# Patient Record
Sex: Male | Born: 1970 | Race: Black or African American | Hispanic: No | Marital: Married | Smoking: Never smoker
Health system: Southern US, Community
[De-identification: ages and names within clinical notes are randomized; demographics above are authoritative.]

## PROBLEM LIST (undated history)

## (undated) DIAGNOSIS — I639 Cerebral infarction, unspecified: Secondary | ICD-10-CM

## (undated) DIAGNOSIS — E119 Type 2 diabetes mellitus without complications: Secondary | ICD-10-CM

## (undated) DIAGNOSIS — I10 Essential (primary) hypertension: Secondary | ICD-10-CM

## (undated) DIAGNOSIS — F419 Anxiety disorder, unspecified: Secondary | ICD-10-CM

## (undated) DIAGNOSIS — M109 Gout, unspecified: Secondary | ICD-10-CM

---

## 1992-09-20 HISTORY — PX: EXPLORATORY LAPAROTOMY: SUR591

## 1999-05-22 HISTORY — PX: ESOPHAGOGASTRODUODENOSCOPY ENDOSCOPY: SHX5814

## 2001-01-03 ENCOUNTER — Emergency Department (HOSPITAL_COMMUNITY): Admission: EM | Admit: 2001-01-03 | Discharge: 2001-01-03 | Payer: Self-pay | Admitting: Emergency Medicine

## 2002-12-18 ENCOUNTER — Encounter: Payer: Self-pay | Admitting: Emergency Medicine

## 2002-12-18 ENCOUNTER — Emergency Department (HOSPITAL_COMMUNITY): Admission: EM | Admit: 2002-12-18 | Discharge: 2002-12-18 | Payer: Self-pay | Admitting: Emergency Medicine

## 2002-12-19 ENCOUNTER — Emergency Department (HOSPITAL_COMMUNITY): Admission: EM | Admit: 2002-12-19 | Discharge: 2002-12-19 | Payer: Self-pay | Admitting: Emergency Medicine

## 2004-05-31 ENCOUNTER — Emergency Department (HOSPITAL_COMMUNITY): Admission: EM | Admit: 2004-05-31 | Discharge: 2004-05-31 | Payer: Self-pay | Admitting: Emergency Medicine

## 2004-07-31 ENCOUNTER — Inpatient Hospital Stay (HOSPITAL_COMMUNITY): Admission: EM | Admit: 2004-07-31 | Discharge: 2004-08-03 | Payer: Self-pay | Admitting: Emergency Medicine

## 2004-08-07 ENCOUNTER — Emergency Department (HOSPITAL_COMMUNITY): Admission: EM | Admit: 2004-08-07 | Discharge: 2004-08-08 | Payer: Self-pay | Admitting: Emergency Medicine

## 2005-08-12 ENCOUNTER — Inpatient Hospital Stay (HOSPITAL_COMMUNITY): Admission: AD | Admit: 2005-08-12 | Discharge: 2005-08-13 | Payer: Self-pay | Admitting: Internal Medicine

## 2006-08-06 ENCOUNTER — Inpatient Hospital Stay (HOSPITAL_COMMUNITY): Admission: EM | Admit: 2006-08-06 | Discharge: 2006-08-09 | Payer: Self-pay | Admitting: Emergency Medicine

## 2006-10-13 ENCOUNTER — Inpatient Hospital Stay (HOSPITAL_COMMUNITY): Admission: EM | Admit: 2006-10-13 | Discharge: 2006-10-17 | Payer: Self-pay | Admitting: Emergency Medicine

## 2009-02-16 ENCOUNTER — Emergency Department (HOSPITAL_COMMUNITY): Admission: EM | Admit: 2009-02-16 | Discharge: 2009-02-16 | Payer: Self-pay | Admitting: Emergency Medicine

## 2009-02-21 ENCOUNTER — Encounter (INDEPENDENT_AMBULATORY_CARE_PROVIDER_SITE_OTHER): Payer: Self-pay | Admitting: Otolaryngology

## 2009-02-21 ENCOUNTER — Ambulatory Visit (HOSPITAL_COMMUNITY): Admission: RE | Admit: 2009-02-21 | Discharge: 2009-02-22 | Payer: Self-pay | Admitting: Otolaryngology

## 2009-11-29 ENCOUNTER — Emergency Department (HOSPITAL_COMMUNITY): Admission: EM | Admit: 2009-11-29 | Discharge: 2009-11-29 | Payer: Self-pay | Admitting: Emergency Medicine

## 2010-12-13 LAB — POCT I-STAT, CHEM 8
BUN: 10 mg/dL (ref 6–23)
Calcium, Ion: 1.12 mmol/L (ref 1.12–1.32)
Chloride: 100 mEq/L (ref 96–112)
Creatinine, Ser: 1 mg/dL (ref 0.4–1.5)
Glucose, Bld: 126 mg/dL — ABNORMAL HIGH (ref 70–99)
HCT: 42 % (ref 39.0–52.0)
Hemoglobin: 14.3 g/dL (ref 13.0–17.0)
Potassium: 4.2 mEq/L (ref 3.5–5.1)
Sodium: 134 mEq/L — ABNORMAL LOW (ref 135–145)
TCO2: 33 mmol/L (ref 0–100)

## 2010-12-28 LAB — GLUCOSE, CAPILLARY
Glucose-Capillary: 109 mg/dL — ABNORMAL HIGH (ref 70–99)
Glucose-Capillary: 124 mg/dL — ABNORMAL HIGH (ref 70–99)
Glucose-Capillary: 136 mg/dL — ABNORMAL HIGH (ref 70–99)
Glucose-Capillary: 140 mg/dL — ABNORMAL HIGH (ref 70–99)
Glucose-Capillary: 187 mg/dL — ABNORMAL HIGH (ref 70–99)
Glucose-Capillary: 191 mg/dL — ABNORMAL HIGH (ref 70–99)
Glucose-Capillary: 213 mg/dL — ABNORMAL HIGH (ref 70–99)
Glucose-Capillary: 278 mg/dL — ABNORMAL HIGH (ref 70–99)

## 2010-12-28 LAB — CBC
HCT: 39.2 % (ref 39.0–52.0)
Hemoglobin: 13.2 g/dL (ref 13.0–17.0)
MCHC: 33.8 g/dL (ref 30.0–36.0)
MCV: 78.8 fL (ref 78.0–100.0)
Platelets: 406 10*3/uL — ABNORMAL HIGH (ref 150–400)
RBC: 4.97 MIL/uL (ref 4.22–5.81)
RDW: 14.6 % (ref 11.5–15.5)
WBC: 4.4 10*3/uL (ref 4.0–10.5)

## 2010-12-28 LAB — BASIC METABOLIC PANEL
BUN: 11 mg/dL (ref 6–23)
CO2: 28 mEq/L (ref 19–32)
Calcium: 9.3 mg/dL (ref 8.4–10.5)
Chloride: 102 mEq/L (ref 96–112)
Creatinine, Ser: 0.93 mg/dL (ref 0.4–1.5)
GFR calc Af Amer: >60 mL/min (ref >60)
GFR calc non Af Amer: >60 mL/min (ref >60)
Glucose, Bld: 152 mg/dL — ABNORMAL HIGH (ref 70–99)
Potassium: 4.4 mEq/L (ref 3.5–5.1)
Sodium: 138 mEq/L (ref 135–145)

## 2010-12-29 LAB — CBC
HCT: 42.6 % (ref 39.0–52.0)
Hemoglobin: 13.9 g/dL (ref 13.0–17.0)
MCHC: 32.6 g/dL (ref 30.0–36.0)
MCV: 79.4 fL (ref 78.0–100.0)
Platelets: 315 10*3/uL (ref 150–400)
RBC: 5.37 MIL/uL (ref 4.22–5.81)
RDW: 14.7 % (ref 11.5–15.5)
WBC: 6.7 10*3/uL (ref 4.0–10.5)

## 2010-12-29 LAB — POCT I-STAT, CHEM 8
BUN: 8 mg/dL (ref 6–23)
Calcium, Ion: 1.15 mmol/L (ref 1.12–1.32)
Chloride: 98 mEq/L (ref 96–112)
Creatinine, Ser: 1.3 mg/dL (ref 0.4–1.5)
Glucose, Bld: 262 mg/dL — ABNORMAL HIGH (ref 70–99)
HCT: 43 % (ref 39.0–52.0)
Hemoglobin: 14.6 g/dL (ref 13.0–17.0)
Potassium: 4.3 mEq/L (ref 3.5–5.1)
Sodium: 134 mEq/L — ABNORMAL LOW (ref 135–145)
TCO2: 30 mmol/L (ref 0–100)

## 2010-12-29 LAB — GLUCOSE, CAPILLARY: Glucose-Capillary: 245 mg/dL — ABNORMAL HIGH (ref 70–99)

## 2010-12-29 LAB — STREP A DNA PROBE: Group A Strep Probe: NEGATIVE

## 2010-12-29 LAB — RAPID STREP SCREEN (MED CTR MEBANE ONLY): Streptococcus, Group A Screen (Direct): NEGATIVE

## 2011-02-02 NOTE — Op Note (Signed)
[name], [name]             ACCOUNT NO.:  [id number]   MEDICAL RECORD NO.:  [id number]          PATIENT TYPE:  OIB   LOCATION:  5127                         FACILITY:  [facility]   PHYSICIAN:  Jefry H. Pollyann Kennedy, MD     DATE OF BIRTH:  05/25/1971   DATE OF PROCEDURE:  02/21/2009  DATE OF DISCHARGE:                               OPERATIVE REPORT   PREOPERATIVE DIAGNOSIS:  Peritonsillar abscess.   POSTOPERATIVE DIAGNOSIS:  Peritonsillar abscess.   PROCEDURE:  Tonsillectomy.   SURGEON:  Jefry H. Pollyann Kennedy, MD   ANESTHESIA:  General endotracheal anesthesia was used.   COMPLICATIONS:  No complications.   BLOOD LOSS:  About 80 mL.   FINDINGS:  Very large and diffusely inflamed tonsils bilaterally, much  worse on the left side with peritonsillar space fibrosis without any  abscess identified today.   HISTORY:  A 40 year old was seen last Sunday with findings consistent  with peritonsillar abscess on the left.  Attempted aspiration revealed  scant amount of pus.  He was treated with antibiotics and over about 3-  day period was found not to be improving.  Decision was then made to  perform tonsillectomy at the next available time.  Risks, benefits,  alternatives, and complications of the procedure were explained to the  patient, seemed to understand, and agreed to surgery.   PROCEDURE:  The patient was taken to the operating room, placed on the  operating table in supine position.  Following induction of general  endotracheal anesthesia, the table was turned, and the patient was  draped in the standard fashion.  A Crowe-Davis mouth gag was inserted  into the oral cavity, used to retract the tongue, and mandible attached  to Mayo stand.  Red rubber catheter was inserted into the right side of  the nose, withdrawn through the mouth and used to retract the soft  palate and uvula.  Combination of electrocautery, blunt dissection and  suction cautery was used to dissect the tonsils.  The  right side was  performed first.  The right tonsil was removed in its entirety and sent  for pathologic evaluation.  This was labeled right tonsil.  Suction  cautery was used to provide hemostasis.  On the left side, the tonsil  was severely inflamed and with fibrotic surrounding, I was unable to  truly identify the peritonsillar plane.  I was unable to identify any  abscess cavity either.  I attempted multiple aspirations with an 18-  guage needle and did not reveal any pus.  This tonsil was dissected out  using combination of cautery and  blunt dissection with a Hurd dissector and was removed in a piecemeal  fashion.  Adequate level of hemostasis was achieved.  The pharynx was  then irrigated with saline and suctioned.  An orogastric tube was used  to aspirate the contents of the stomach.  The patient was awakened,  extubated, and transferred to recovery in stable condition.      Jefry H. Pollyann Kennedy, MD  Electronically Signed     JHR/MEDQ  D:  02/21/2009  T:  02/22/2009  Job:  [id number]

## 2011-02-02 NOTE — Consult Note (Signed)
[name], [name]             ACCOUNT NO.:  [id number]   MEDICAL RECORD NO.:  [id number]          PATIENT TYPE:  EMS   LOCATION:  MAJO                         FACILITY:  [facility]   PHYSICIAN:  Jefry H. Pollyann Kennedy, MD     DATE OF BIRTH:  1970-10-09   DATE OF CONSULTATION:  DATE OF DISCHARGE:                                 CONSULTATION   TIME:  7:30 p.m.   REASON FOR CONSULTATION:  Severe sore throat and peritonsillar abscess.   HISTORY:  This is a 40 year old with a 1-week history of progressively  worsening sore throat, mainly on the left side.  He has no prior history  of tonsil or throat problems.  He has been able to eat and drink  reasonably well.   PAST MEDICAL HISTORY:  Significant for diabetes.   PHYSICAL EXAMINATION:  Healthy-appearing gentleman, in no distress.  He  is moving air well without having trouble handling secretions.  He does  not have a muffled voice.  He is slightly tender adenopathy in the left  tonsil region.  There is no significant trismus.  Nasal exam  unremarkable.  Oral cavity and pharynx reveal fullness of the left  tonsil and left soft palate.   IMPRESSION:  Possible peritonsillar abscess.   PLAN:  Perform incision, drainage, and aspiration.   PROCEDURE IN DETAIL:  Xylocaine with epinephrine was infiltrated into  the left side of soft palate and anterior faucil arch. A 18-gauge needle  was used to attempt aspiration in multiple locations.  The very small  amount of pus was obtained.  It was left than 0.5 mL.  A 11 scalpel was  used to incise the mucosa overlying the aspirated site and the tonsil  hemostat was used to spread apart the tissues within the peritonsillar  plane.  There was no further pus obtained.  He tolerated the procedure  well.   IMPRESSION:  Status post aspiration of very early peritonsillar abscess.   PLAN:  Start him on antibiotics with Augmentin and treat him with  analgesics.  He will follow up with me in the office in 2  days to  monitor his progress or he will contact me tomorrow if he gets any  worse.      Jefry H. Pollyann Kennedy, MD  Electronically Signed     JHR/MEDQ  D:  02/17/2009  T:  02/17/2009  Job:  [id number]

## 2011-02-05 NOTE — Discharge Summary (Signed)
NAMEHARROLD, [name]             ACCOUNT NO.:  [id number]   MEDICAL RECORD NO.:  [id number]          PATIENT TYPE:  INP   LOCATION:  0364                         FACILITY:  [facility]   PHYSICIAN:  Jackie Plum, M.D.DATE OF BIRTH:  [birth year]   DATE OF ADMISSION:  07/31/2004  DATE OF DISCHARGE:  08/03/2004                                 DISCHARGE SUMMARY   DISCHARGE DIAGNOSES:  1.  Hyperosmolar hyperglycemic state, nonketotic, resolved.  2.  Newly diagnosed diabetes mellitus, uncontrolled.  3.  Acute renal failure, resolved.  4.  Electrolyte imbalance, resolved.  5.  Hypertension.  6.  Obesity.  7.  Dyslipidemia.   DISCHARGE MEDICATIONS:  1.  Zocor 20 mg daily.  2.  Lovenox.  3.  Glucotrol 7.5mg  daily  4.  Lantus 12 units subcutaneous injections daily.  5.  Altace 5 mg daily.  6.  Labetalol 400 mg daily.  7.  Glucophage 80 mg daily.   ACTIVITY:  As tolerated.   DIET:  2000 calorie ADA diet.   FOLLOWUP:  The patient to follow up with Dr. Concepcion Elk on August 10, 2004 at  10:45 a.m. for follow up.   DISCHARGE INSTRUCTIONS:  Instructed to take his medications and diet as  instructed.  He is to report to his M.D. or the emergency room if he should  have any problems.   REASON FOR HOSPITALIZATION:  Diabetic ketoacidosis.  Please see H&P dictated  by Dr. Jasmine Pang.  The patient presented with polyuria, polydipsia, with  increasing disorientation.  He did not have any prior history of diabetes  mellitus.  He had been feeling very fatigued with mild visual blurring.  He  did not have any dysuria, fever, or chills.  He has a history of gout, and  has been taking indomethacin for this.  In the emergency room, the patient's  blood pressure was elevated at 171/105, pulse of 140, respirations 20,  saturations of 93% on room air.  According to the H&P by Dr. Jasmine Pang,  the patient with cardiac auscultation without any gallops or murmurs.  Her  pulmonary auscultation did  not reveal an adventitious sounds.  He was not  cyanotic, and dorsalis pedis pulses were 1 to 2+.  Cranial nerves II-XII  were grossly intact.  Laboratory work indicated a pH of 7.29; however, his  bicarbonate was 22, and his sodium was 124, with a BUN of 39, creatinine of  4.1.  His phosphorus was only 1.  ECG shows sinus tachycardia.  He was  initially admitted for suspected DKA; however, on further evaluation, it was  realized that the patient did not have a consistent anion gap.  His  bicarbonate remained within normal limits, and the isolated pH of 7.9, which  was in acidemic status, and was thought not to be related to DKA, but rather  possibly to ketoacidosis.  The patient was, therefore, thought to have  rather hyperosmolar hyperglycemic nonketotic state.  He was managed with IV  insulin for this with IV fluids.  His fluid and electrolyte status was  carefully monitored and corrected appropriately, and with these measures,  the patient's hyperglycemia improved, with resolution of his acute renal  failure, which was due to volume depletion with possible ATN.  The patient  was subsequently transferred to a telemetry bed.  He received diabetes  education by the diabetes coordinator, and also has been taught how to  inject himself with Lantus.  The patient is a truck driver, and he is going  to check with his employer as to policies regarding diabetes, especially for  patients on Lantus.  He has been asked not to drive until this is done.  On  rounds today, the patient feels fine, does not have any weakness, no  polyuria, polydipsia, fever, or chills.  He is back to his baseline status,  and is ready for discharge.  His vital signs were stable.  Repeat radial  pulse by me was 84 beats per minute.  He is not clinically dehydrated.  His  mucous membranes are moist without any exudate or erythema on oropharynx  exam.  Lungs are clear to auscultation.  Cardiac exam was unremarkable.   Abdomen was soft, nontender.  No edema of extremities.  He is appropriate  for discharge today with follow ups as planned.  He will also be following  with the outpatient diabetes center.  We will assist him to get himself a  Glucometer and lancets and other required supplies for him to be able to  monitor his glucose carefully at home.  He has received adequate education  with regard to diabetes.  He has been shown a video tape on diabetes, and he  has expressed a clear understanding.   I spent more than preparing patient for discharge today.     [name]   GO/MEDQ  D:  08/03/2004  T:  08/03/2004  Job:  [id number]   cc:   Fleet Contras, M.D.  [address]  [place]  [place] [address]  Fax: [phone]

## 2011-02-05 NOTE — Discharge Summary (Signed)
[name], [name]             ACCOUNT NO.:  [id number]   MEDICAL RECORD NO.:  [id number]          PATIENT TYPE:  INP   LOCATION:  2004                         FACILITY:  [facility]   PHYSICIAN:  Fleet Contras, M.D.    DATE OF BIRTH:  [birth year]   DATE OF ADMISSION:  08/06/2006  DATE OF DISCHARGE:  08/09/2006                               DISCHARGE SUMMARY   ADMISSION DIAGNOSES:  1. Diabetic ketoacidosis.  2. Hyperkalemia.  3. Renal insufficiency.   DISCHARGE DIAGNOSES:  1. Diabetic ketoacidosis, resolved.  2. Uncontrolled type 2 diabetes mellitus.  3. Hyperkalemia, resolved.  4. Renal insufficiency, resolved.  5. Hypertension.  6. Dyslipidemia.   HISTORY OF PRESENTING ILLNESS:  Mr. [name] is a 40 year old African-  American gentleman with past medical history significant for type 2  diabetes mellitus, hypertension, and dyslipidemia.  He was last seen in  my office about a year ago and has not been compliant with his office  visits or his mediations.  He apparently went on trip to the [place]  about 2 weeks ago and forgot to take his medications with him, and he  came back with complaints of frequent urination, generally feeling  unwell, weakness, and crampy abdominal pain.  On evaluation at the  emergency room, he was noted to be not in acute respiratory or painful  distress.  His initial serum glucose was 752 with a pH of 7.28, a serum  potassium of 7.4.  His BUN and creatinine were also elevated at 46/2.4.  His sodium was 120 and chloride was 81, bicarbonate of 13.  His  hemoglobin A1c was 16.  He was initially admitted to a stepdown bed with  a cardiac monitor and started on a Glucommander protocol.   HOSPITAL COURSE:  On admission, patient was started on a Glucommander  protocol with intravenous insulin infusion.  His ABGs were monitored q.  hourly as well as monitoring of his potassium and renal function.  His  urine intake and output were also monitored closely, and  he made  significant improvement within 24 hours and was feeling much better, and  his sugars were down to the 200s, and his potassium was down to 3.8, and  he had to require potassium replacements orally.  His condition  continued to improve during the course of hospitalization.  Was started  on subcutaneous Lantus insulin once his sugars were down and tapered off  the intravenous insulin and Glucommander.  He tolerated this well.  He  started to eat normally without any nausea, abdominal pain, or nausea.   Today, he is feeling much better.  He is not in any acute pain or  respiratory distress.  His glucose this morning is in the 160s.  His  blood pressure is 133/77, heart rate of 85, respiratory rate of 20.  O2  sats on room air are 99%, and temperature is 97 degrees' Fahrenheit.  His chest is clear to auscultation.  Heart sounds 1 and 2 are heard with  no murmurs.  Abdomen is soft, nontender, no masses.  Bowel sounds are  present.  Extremities show no edema,  no calf tenderness, or swelling.   LABORATORY DATA:  Today shows a sodium of 134, potassium 3.9, chloride  105, bicarbonate of 24, BUN of 10, creatinine 1.0, glucose of 204.  His  lipid profile shows a total cholesterol of 195, triglycerides 427, HDL  of 35, and LDL could not be calculated.  TSH is 2.99.  He is therefore  considered stable for discharge home today.   DISCHARGE MEDICATIONS:  1. Lantus insulin 40 units subcu q. h.s.  2. Lipitor 10 mg daily.  3. Metformin 500 mg b.i.d.  4. Altace 10 mg daily.  5. Aspirin 81 mg daily.   He is to follow up with me in 1 week, and he has been advised to take  his medications as prescribed and make sure he makes his followup  appointments.  He is to be on a 2-g sodium, carbohydrate-modified diet,  and he is not to return to work for 2 weeks.  This plan of care was  discussed with him and his questions answered.      Fleet Contras, M.D.  Electronically Signed     EA/MEDQ  D:   08/09/2006  T:  08/10/2006  Job:  [id number]
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
## 2011-02-05 NOTE — Discharge Summary (Signed)
[name], [name]             ACCOUNT NO.:  [id number]   MEDICAL RECORD NO.:  [id number]          PATIENT TYPE:  INP   LOCATION:  0364                         [facility]ACILITY:  [facility]   PHYSICIAN:  Jackie Plum, M.D.DATE OF BIRTH:  [birth year]   DATE OF ADMISSION:  07/31/2004  DATE OF DISCHARGE:  08/03/2004                                 DISCHARGE SUMMARY   ADDENDUM:  This is an addendum to previous discharge summary #[id number].   DISCHARGE LABORATORY DATA:  WBC count 6.2, hemoglobin 12.9, hematocrit 38.1,  MCV 76.8, platelet count 239.  Sodium 140, potassium 4.3, chloride 112, CO2  24, glucose 237, BUN 15, creatinine 1.6, calcium 8.4, phosphorus 2.7, total  cholesterol 133, triglycerides 624 (patient apparently on had eaten when  sample was taken - it will need to be repeated), HDL 18.  Total iron 117,  total iron binding capacity 201, % saturation 58, B12 327, folate 4.7,  ferritin 444.   CONSULTANTS:  Not applicable.   PROCEDURES:  Not applicable.   CONDITION ON DISCHARGE:  Improved and satisfactory.   DISPOSITION:  The patient is going home.     [name]   GO/MEDQ  D:  08/03/2004  T:  08/03/2004  Job:  [id number]

## 2011-02-05 NOTE — Discharge Summary (Signed)
NAMEJACKEY, HOUSEY NO.:  [id number]   MEDICAL RECORD NO.:  [id number]          PATIENT TYPE:  INP   LOCATION:  5714                         FACILITY:  [facility]   PHYSICIAN:  Fleet Contras, M.D.    DATE OF BIRTH:  26-Sep-1970   DATE OF ADMISSION:  10/13/2006  DATE OF DISCHARGE:  10/17/2006                               DISCHARGE SUMMARY   HISTORY OF PRESENTING ILLNESS:  Mr. [name] is a 40 year old  African American gentleman with past medical history significant for  type 2 diabetes mellitus, hypertension, dyslipidemia, gout, and medical  noncompliance.  This is another required admission for this gentleman  who has been noncompliant with his medications and office appointments.  He presented to the emergency room at [facility] brought in by  the EMS after his wife found him unresponsive at home.  He had  apparently been out of his medication therapy for a few days.  Unable to  keep his food, drink or medications down.  His wife found him at home on  the floor.  Was unable to state how long he had been there.  She  therefore called the EMS who brought him to the emergency room.  He had  not been checking his sugars for a few days, and he had no history of  any recent cold, cough, fevers, chills, or flu-like symptoms.  He had no  chest pain, shortness of breath, headaches, dizziness, or seizures.   In the emergency room, his mental status was altered.  Blood pressure  was 100/59, heart rate of 140.  Initial laboratory data showed glucose  of 1033.  His pH was 7.1.  Potassium was 7.9, BUN 62, creatinine 3.2,  and a bicarbonate of 4.  He was in diabetic ketoacidosis with severe  hyperkalemia, hyponatremia, renal insufficiency, dehydration, and severe  hyperglycemia requiring intensive  management.   HOSPITAL COURSE:  On admission, the patient was placed on a Glucommander  protocol, intravenous fluids, intravenous saline.  His input and output  were  monitored closely as well as his electrolytes and pH.  His  condition began to improve.  Within 24 hours, his potassium was down to  4.9.  Glucose was down to 500, BUN down to 58, creatinine 3.19, and  sodium 137.  His magnesium was 3.3, phosphorus was 3.5.  An EKG showed  sinus tachycardia.  The assistance of elink ICU physician was utilized  during the course of his hospitalization.  The patient became more  alert, oriented and conversational within 24 hours of admission.  His  condition continued to improve.  On October 15, 2006, his renal  insufficiency, electrolyte imbalance as well as dehydration have  resolved.  His glucose was down to 183.  It was started on Lantus  insulin as well as sliding scale NovoLog insulin.  Diet was resumed.  On  October 17, 2006, he was feeling much better, tolerating a full diet,  and ambulating.   PHYSICAL EXAMINATION:  VITAL SIGNS:  His vital signs were stable, and he  was well hydrated.  CHEST:  His chest was clear  to auscultation.  HEART:  Heart sounds one and two were heard, with no murmurs.  ABDOMEN:  Soft and nontender.  Bowel sounds were present.  EXTREMITIES:  Showed no edema.  No calf tenderness or swelling.  PSYCHIATRIC:  He was alert and oriented x3, with no focal neurological  deficits.   LABORATORY DATA:  Laboratory data then showed sodium of 138, potassium  3.3, chloride 107, bicarbonate of 26, BUN of 9, creatinine 0.95, and  glucose of 152.  He was therefore considered stable for discharge.   DISCHARGE DIAGNOSES:  1. Diabetic ketoacidosis.  2. Severe hyperglycemia, resolved.  3. Severe hyperkalemia, resolved.  4. Severe hyponatremia, resolved.  5. Dehydration, resolved.  6. Renal insufficiency acute, resolved.  7. Medical noncompliance.  The patient was again advised and      encouraged to show adequate compliance with medications and follow-      up office visits.   DISCHARGE MEDICATIONS:  1. Metformin 850 mg b.i.d.  2.  Zocor 20 mg daily.  3. Altace 10 mg daily.  4. Januvia 100 mg daily.  5. Lantus insulin 40 units at bedtime.   He is to follow up with me on October 19, 2006, at 9:00 a.m.  This plan  of care was discussed with him, and his questions were answered.      Fleet Contras, M.D.  Electronically Signed     EA/MEDQ  D:  11/30/2006  T:  12/01/2006  Job:  [id number]
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
## 2011-04-10 ENCOUNTER — Emergency Department (HOSPITAL_COMMUNITY): Payer: Self-pay

## 2011-04-10 ENCOUNTER — Emergency Department (HOSPITAL_COMMUNITY)
Admission: EM | Admit: 2011-04-10 | Discharge: 2011-04-10 | Disposition: A | Discharge status: Home / Self Care | Payer: Self-pay | Attending: Emergency Medicine | Admitting: Emergency Medicine

## 2011-04-10 DIAGNOSIS — R12 Heartburn: Secondary | ICD-10-CM | POA: Insufficient documentation

## 2011-04-10 DIAGNOSIS — E119 Type 2 diabetes mellitus without complications: Secondary | ICD-10-CM | POA: Insufficient documentation

## 2011-04-10 DIAGNOSIS — R112 Nausea with vomiting, unspecified: Secondary | ICD-10-CM | POA: Insufficient documentation

## 2011-04-10 DIAGNOSIS — E785 Hyperlipidemia, unspecified: Secondary | ICD-10-CM | POA: Insufficient documentation

## 2011-04-10 DIAGNOSIS — R197 Diarrhea, unspecified: Secondary | ICD-10-CM | POA: Insufficient documentation

## 2011-04-10 DIAGNOSIS — I10 Essential (primary) hypertension: Secondary | ICD-10-CM | POA: Insufficient documentation

## 2011-04-10 DIAGNOSIS — R141 Gas pain: Secondary | ICD-10-CM | POA: Insufficient documentation

## 2011-04-10 DIAGNOSIS — R109 Unspecified abdominal pain: Secondary | ICD-10-CM | POA: Insufficient documentation

## 2011-04-10 DIAGNOSIS — R142 Eructation: Secondary | ICD-10-CM | POA: Insufficient documentation

## 2011-04-10 LAB — URINALYSIS, ROUTINE W REFLEX MICROSCOPIC
Bilirubin Urine: NEGATIVE
Glucose, UA: NEGATIVE mg/dL
Hgb urine dipstick: NEGATIVE
Ketones, ur: NEGATIVE mg/dL
Leukocytes, UA: NEGATIVE
Nitrite: NEGATIVE
Protein, ur: 30 mg/dL — AB
Specific Gravity, Urine: 1.023 (ref 1.005–1.030)
Urobilinogen, UA: 1 mg/dL (ref 0.0–1.0)
pH: 6 (ref 5.0–8.0)

## 2011-04-10 LAB — CBC
MCV: 74.9 fL — ABNORMAL LOW (ref 78.0–100.0)
Platelets: 251 10*3/uL (ref 150–400)
RBC: 5.61 MIL/uL (ref 4.22–5.81)
RDW: 13.4 % (ref 11.5–15.5)
WBC: 5 10*3/uL (ref 4.0–10.5)

## 2011-04-10 LAB — BASIC METABOLIC PANEL
CO2: 33 mEq/L — ABNORMAL HIGH (ref 19–32)
Chloride: 91 mEq/L — ABNORMAL LOW (ref 96–112)
Creatinine, Ser: 1.17 mg/dL (ref 0.50–1.35)
GFR calc Af Amer: >60 mL/min (ref >60)
Potassium: 3.2 mEq/L — ABNORMAL LOW (ref 3.5–5.1)

## 2011-04-10 LAB — URINE MICROSCOPIC-ADD ON

## 2011-04-12 LAB — HEPATIC FUNCTION PANEL
ALT: 14 U/L (ref 0–53)
AST: 14 U/L (ref 0–37)
Albumin: 3.9 g/dL (ref 3.5–5.2)
Bilirubin, Direct: 0.2 mg/dL (ref 0.0–0.3)
Indirect Bilirubin: 0.6 mg/dL (ref 0.3–0.9)
Total Protein: 7.9 g/dL (ref 6.0–8.3)

## 2011-04-12 LAB — GLUCOSE, CAPILLARY: Glucose-Capillary: 176 mg/dL — ABNORMAL HIGH (ref 70–99)

## 2011-04-12 LAB — LIPASE, BLOOD: Lipase: 50 U/L (ref 11–59)

## 2011-04-12 LAB — LACTIC ACID, PLASMA: Lactic Acid, Venous: 0.9 mmol/L (ref 0.5–2.2)

## 2011-05-02 ENCOUNTER — Emergency Department (HOSPITAL_COMMUNITY): Payer: Self-pay

## 2011-05-02 ENCOUNTER — Emergency Department (HOSPITAL_COMMUNITY)
Admission: EM | Admit: 2011-05-02 | Discharge: 2011-05-03 | Disposition: A | Discharge status: Home / Self Care | Payer: Self-pay | Attending: Emergency Medicine | Admitting: Emergency Medicine

## 2011-05-02 DIAGNOSIS — I10 Essential (primary) hypertension: Secondary | ICD-10-CM | POA: Insufficient documentation

## 2011-05-02 DIAGNOSIS — R112 Nausea with vomiting, unspecified: Secondary | ICD-10-CM | POA: Insufficient documentation

## 2011-05-02 DIAGNOSIS — R197 Diarrhea, unspecified: Secondary | ICD-10-CM | POA: Insufficient documentation

## 2011-05-02 DIAGNOSIS — E785 Hyperlipidemia, unspecified: Secondary | ICD-10-CM | POA: Insufficient documentation

## 2011-05-02 DIAGNOSIS — R1013 Epigastric pain: Secondary | ICD-10-CM | POA: Insufficient documentation

## 2011-05-02 DIAGNOSIS — R1032 Left lower quadrant pain: Secondary | ICD-10-CM | POA: Insufficient documentation

## 2011-05-02 DIAGNOSIS — E119 Type 2 diabetes mellitus without complications: Secondary | ICD-10-CM | POA: Insufficient documentation

## 2011-05-02 LAB — URINALYSIS, ROUTINE W REFLEX MICROSCOPIC
Bilirubin Urine: NEGATIVE
Ketones, ur: NEGATIVE mg/dL
Nitrite: NEGATIVE
Urobilinogen, UA: 0.2 mg/dL (ref 0.0–1.0)

## 2011-05-02 LAB — COMPREHENSIVE METABOLIC PANEL WITH GFR
AST: 18 U/L (ref 0–37)
Albumin: 4 g/dL (ref 3.5–5.2)
Alkaline Phosphatase: 56 U/L (ref 39–117)
BUN: 16 mg/dL (ref 6–23)
CO2: 28 meq/L (ref 19–32)
Chloride: 96 meq/L (ref 96–112)
GFR calc non Af Amer: >60 mL/min (ref >60)
Potassium: 3.8 meq/L (ref 3.5–5.1)
Total Bilirubin: 0.3 mg/dL (ref 0.3–1.2)

## 2011-05-02 LAB — COMPREHENSIVE METABOLIC PANEL
ALT: 28 U/L (ref 0–53)
Calcium: 9.6 mg/dL (ref 8.4–10.5)
Creatinine, Ser: 1.21 mg/dL (ref 0.50–1.35)
GFR calc Af Amer: >60 mL/min (ref >60)
Glucose, Bld: 138 mg/dL — ABNORMAL HIGH (ref 70–99)
Sodium: 135 mEq/L (ref 135–145)
Total Protein: 8.3 g/dL (ref 6.0–8.3)

## 2011-05-02 LAB — DIFFERENTIAL
Basophils Absolute: 0 10*3/uL (ref 0.0–0.1)
Basophils Relative: 0 % (ref 0–1)
Eosinophils Absolute: 0 K/uL (ref 0.0–0.7)
Eosinophils Relative: 0 % (ref 0–5)
Lymphocytes Relative: 25 % (ref 12–46)
Lymphs Abs: 1.4 K/uL (ref 0.7–4.0)
Monocytes Absolute: 0.4 10*3/uL (ref 0.1–1.0)
Monocytes Relative: 8 % (ref 3–12)
Neutro Abs: 3.8 K/uL (ref 1.7–7.7)
Neutrophils Relative %: 67 % (ref 43–77)

## 2011-05-02 LAB — CBC
HCT: 41.1 % (ref 39.0–52.0)
Hemoglobin: 14.5 g/dL (ref 13.0–17.0)
MCH: 26.8 pg (ref 26.0–34.0)
MCHC: 35.3 g/dL (ref 30.0–36.0)
MCV: 76 fL — ABNORMAL LOW (ref 78.0–100.0)
Platelets: 308 K/uL (ref 150–400)
RBC: 5.41 MIL/uL (ref 4.22–5.81)
RDW: 14 % (ref 11.5–15.5)
WBC: 5.6 K/uL (ref 4.0–10.5)

## 2011-05-02 LAB — LACTIC ACID, PLASMA: Lactic Acid, Venous: 1.1 mmol/L (ref 0.5–2.2)

## 2011-05-02 LAB — LIPASE, BLOOD: Lipase: 29 U/L (ref 11–59)

## 2011-05-04 ENCOUNTER — Emergency Department (HOSPITAL_COMMUNITY): Payer: Self-pay

## 2011-05-04 ENCOUNTER — Emergency Department (HOSPITAL_COMMUNITY)
Admission: EM | Admit: 2011-05-04 | Discharge: 2011-05-04 | Disposition: A | Discharge status: Home / Self Care | Payer: Self-pay | Attending: Emergency Medicine | Admitting: Emergency Medicine

## 2011-05-04 ENCOUNTER — Encounter (HOSPITAL_COMMUNITY): Payer: Self-pay

## 2011-05-04 DIAGNOSIS — I10 Essential (primary) hypertension: Secondary | ICD-10-CM | POA: Insufficient documentation

## 2011-05-04 DIAGNOSIS — E785 Hyperlipidemia, unspecified: Secondary | ICD-10-CM | POA: Insufficient documentation

## 2011-05-04 DIAGNOSIS — Z794 Long term (current) use of insulin: Secondary | ICD-10-CM | POA: Insufficient documentation

## 2011-05-04 DIAGNOSIS — E119 Type 2 diabetes mellitus without complications: Secondary | ICD-10-CM | POA: Insufficient documentation

## 2011-05-04 DIAGNOSIS — R109 Unspecified abdominal pain: Secondary | ICD-10-CM | POA: Insufficient documentation

## 2011-05-04 DIAGNOSIS — R197 Diarrhea, unspecified: Secondary | ICD-10-CM | POA: Insufficient documentation

## 2011-05-04 DIAGNOSIS — R112 Nausea with vomiting, unspecified: Secondary | ICD-10-CM | POA: Insufficient documentation

## 2011-05-04 HISTORY — DX: Essential (primary) hypertension: I10

## 2011-05-04 LAB — DIFFERENTIAL
Basophils Absolute: 0 10*3/uL (ref 0.0–0.1)
Basophils Relative: 1 % (ref 0–1)
Eosinophils Absolute: 0 10*3/uL (ref 0.0–0.7)
Eosinophils Relative: 1 % (ref 0–5)
Lymphs Abs: 1.4 10*3/uL (ref 0.7–4.0)
Neutrophils Relative %: 56 % (ref 43–77)

## 2011-05-04 LAB — GLUCOSE, CAPILLARY: Glucose-Capillary: 129 mg/dL — ABNORMAL HIGH (ref 70–99)

## 2011-05-04 LAB — URINALYSIS, ROUTINE W REFLEX MICROSCOPIC
Glucose, UA: NEGATIVE mg/dL
Hgb urine dipstick: NEGATIVE
Ketones, ur: NEGATIVE mg/dL
Leukocytes, UA: NEGATIVE
Protein, ur: NEGATIVE mg/dL
pH: 5.5 (ref 5.0–8.0)

## 2011-05-04 LAB — COMPREHENSIVE METABOLIC PANEL
ALT: 27 U/L (ref 0–53)
AST: 19 U/L (ref 0–37)
Albumin: 4 g/dL (ref 3.5–5.2)
Alkaline Phosphatase: 57 U/L (ref 39–117)
BUN: 15 mg/dL (ref 6–23)
CO2: 30 mEq/L (ref 19–32)
Calcium: 9.8 mg/dL (ref 8.4–10.5)
Chloride: 94 mEq/L — ABNORMAL LOW (ref 96–112)
Creatinine, Ser: 1.35 mg/dL (ref 0.50–1.35)
GFR calc Af Amer: >60 mL/min (ref >60)
GFR calc non Af Amer: 59 mL/min — ABNORMAL LOW (ref >60)
Glucose, Bld: 123 mg/dL — ABNORMAL HIGH (ref 70–99)
Potassium: 4.5 mEq/L (ref 3.5–5.1)
Sodium: 131 mEq/L — ABNORMAL LOW (ref 135–145)
Total Bilirubin: 0.6 mg/dL (ref 0.3–1.2)
Total Protein: 8.3 g/dL (ref 6.0–8.3)

## 2011-05-04 LAB — CBC
Platelets: 292 10*3/uL (ref 150–400)
RBC: 5.57 MIL/uL (ref 4.22–5.81)
RDW: 13.7 % (ref 11.5–15.5)
WBC: 4.1 10*3/uL (ref 4.0–10.5)

## 2011-05-04 MED ORDER — IOHEXOL 300 MG/ML  SOLN
100.0000 mL | Freq: Once | INTRAMUSCULAR | Status: AC | PRN
  Administered 2011-05-04: 100 mL via INTRAVENOUS
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
## 2013-08-17 ENCOUNTER — Encounter (HOSPITAL_COMMUNITY): Payer: Self-pay | Admitting: Emergency Medicine

## 2013-08-17 ENCOUNTER — Inpatient Hospital Stay (HOSPITAL_COMMUNITY)
Admission: EM | Admit: 2013-08-17 | Discharge: 2013-08-19 | DRG: 638 | Disposition: A | Discharge status: Home / Self Care | Payer: Managed Care, Other (non HMO) | Attending: Internal Medicine | Admitting: Internal Medicine

## 2013-08-17 DIAGNOSIS — I10 Essential (primary) hypertension: Secondary | ICD-10-CM | POA: Diagnosis present

## 2013-08-17 DIAGNOSIS — IMO0002 Reserved for concepts with insufficient information to code with codable children: Secondary | ICD-10-CM | POA: Diagnosis present

## 2013-08-17 DIAGNOSIS — N390 Urinary tract infection, site not specified: Secondary | ICD-10-CM | POA: Diagnosis present

## 2013-08-17 DIAGNOSIS — E1140 Type 2 diabetes mellitus with diabetic neuropathy, unspecified: Secondary | ICD-10-CM | POA: Diagnosis present

## 2013-08-17 DIAGNOSIS — Z91199 Patient's noncompliance with other medical treatment and regimen due to unspecified reason: Secondary | ICD-10-CM

## 2013-08-17 DIAGNOSIS — Z9119 Patient's noncompliance with other medical treatment and regimen: Secondary | ICD-10-CM

## 2013-08-17 DIAGNOSIS — E1101 Type 2 diabetes mellitus with hyperosmolarity with coma: Principal | ICD-10-CM | POA: Diagnosis present

## 2013-08-17 DIAGNOSIS — E870 Hyperosmolality and hypernatremia: Secondary | ICD-10-CM

## 2013-08-17 DIAGNOSIS — E871 Hypo-osmolality and hyponatremia: Secondary | ICD-10-CM | POA: Diagnosis present

## 2013-08-17 DIAGNOSIS — E119 Type 2 diabetes mellitus without complications: Secondary | ICD-10-CM

## 2013-08-17 HISTORY — DX: Gout, unspecified: M10.9

## 2013-08-17 HISTORY — DX: Type 2 diabetes mellitus without complications: E11.9

## 2013-08-17 LAB — POCT I-STAT 3, VENOUS BLOOD GAS (G3P V)
Acid-Base Excess: 3 mmol/L — ABNORMAL HIGH (ref 0.0–2.0)
Bicarbonate: 29.1 mEq/L — ABNORMAL HIGH (ref 20.0–24.0)
O2 Saturation: 34 %
TCO2: 31 mmol/L (ref 0–100)
pCO2, Ven: 48.5 mmHg (ref 45.0–50.0)
pH, Ven: 7.386 — ABNORMAL HIGH (ref 7.250–7.300)
pO2, Ven: 21 mmHg — CL (ref 30.0–45.0)

## 2013-08-17 LAB — URINE MICROSCOPIC-ADD ON

## 2013-08-17 LAB — URINALYSIS, ROUTINE W REFLEX MICROSCOPIC
Bilirubin Urine: NEGATIVE
Hgb urine dipstick: NEGATIVE
Ketones, ur: NEGATIVE mg/dL
Nitrite: POSITIVE — AB
Protein, ur: NEGATIVE mg/dL
Urobilinogen, UA: 0.2 mg/dL (ref 0.0–1.0)
pH: 6 (ref 5.0–8.0)

## 2013-08-17 LAB — COMPREHENSIVE METABOLIC PANEL
ALT: 44 U/L (ref 0–53)
AST: 14 U/L (ref 0–37)
Albumin: 3.9 g/dL (ref 3.5–5.2)
Alkaline Phosphatase: 87 U/L (ref 39–117)
BUN: 21 mg/dL (ref 6–23)
CO2: 28 mEq/L (ref 19–32)
Calcium: 9.7 mg/dL (ref 8.4–10.5)
Chloride: 84 mEq/L — ABNORMAL LOW (ref 96–112)
GFR calc Af Amer: 64 mL/min — ABNORMAL LOW (ref >90)
GFR calc non Af Amer: 55 mL/min — ABNORMAL LOW (ref >90)
Glucose, Bld: 675 mg/dL (ref 70–99)
Potassium: 5.5 mEq/L — ABNORMAL HIGH (ref 3.5–5.1)
Total Bilirubin: 0.4 mg/dL (ref 0.3–1.2)
Total Protein: 8.2 g/dL (ref 6.0–8.3)

## 2013-08-17 LAB — GLUCOSE, CAPILLARY
Glucose-Capillary: 419 mg/dL — ABNORMAL HIGH (ref 70–99)
Glucose-Capillary: 277 mg/dL — ABNORMAL HIGH (ref 70–99)

## 2013-08-17 LAB — CBC WITH DIFFERENTIAL/PLATELET
Basophils Relative: 0 % (ref 0–1)
Eosinophils Absolute: 0 10*3/uL (ref 0.0–0.7)
Eosinophils Relative: 0 % (ref 0–5)
HCT: 41.1 % (ref 39.0–52.0)
Hemoglobin: 14.9 g/dL (ref 13.0–17.0)
Lymphocytes Relative: 23 % (ref 12–46)
Lymphs Abs: 1.6 10*3/uL (ref 0.7–4.0)
MCH: 27.4 pg (ref 26.0–34.0)
MCHC: 36.3 g/dL — ABNORMAL HIGH (ref 30.0–36.0)
MCV: 75.7 fL — ABNORMAL LOW (ref 78.0–100.0)
Monocytes Relative: 9 % (ref 3–12)
Neutrophils Relative %: 68 % (ref 43–77)
RBC: 5.43 MIL/uL (ref 4.22–5.81)
WBC: 6.9 10*3/uL (ref 4.0–10.5)

## 2013-08-17 LAB — LACTIC ACID, PLASMA: Lactic Acid, Venous: 3.1 mmol/L — ABNORMAL HIGH (ref 0.5–2.2)

## 2013-08-17 LAB — POCT I-STAT TROPONIN I

## 2013-08-17 MED ORDER — SODIUM CHLORIDE 0.9 % IV BOLUS (SEPSIS)
1000.0000 mL | Freq: Once | INTRAVENOUS | Status: AC
  Administered 2013-08-17: 1000 mL via INTRAVENOUS

## 2013-08-17 MED ORDER — SODIUM CHLORIDE 0.9 % IV SOLN
INTRAVENOUS | Status: DC
  Administered 2013-08-18 (×4): via INTRAVENOUS

## 2013-08-17 MED ORDER — ONDANSETRON HCL 4 MG PO TABS: 4.0000 mg | ORAL_TABLET | Freq: Four times a day (QID) | ORAL | Status: DC | PRN

## 2013-08-17 MED ORDER — DOCUSATE SODIUM 100 MG PO CAPS
100.0000 mg | ORAL_CAPSULE | Freq: Two times a day (BID) | ORAL | Status: DC
  Administered 2013-08-18 – 2013-08-19 (×3): 100 mg via ORAL
  Filled 2013-08-17 (×6): qty 1

## 2013-08-17 MED ORDER — INSULIN ASPART 100 UNIT/ML ~~LOC~~ SOLN
0.0000 [IU] | Freq: Every day | SUBCUTANEOUS | Status: DC
  Administered 2013-08-18: 3 [IU] via SUBCUTANEOUS

## 2013-08-17 MED ORDER — SODIUM CHLORIDE 0.9 % IV SOLN
INTRAVENOUS | Status: DC
  Administered 2013-08-17: 5.3 [IU]/h via INTRAVENOUS
  Filled 2013-08-17: qty 1

## 2013-08-17 MED ORDER — ONDANSETRON HCL 4 MG/2ML IJ SOLN: 4.0000 mg | Freq: Four times a day (QID) | INTRAMUSCULAR | Status: DC | PRN

## 2013-08-17 MED ORDER — INSULIN ASPART 100 UNIT/ML ~~LOC~~ SOLN
0.0000 [IU] | Freq: Three times a day (TID) | SUBCUTANEOUS | Status: DC
  Administered 2013-08-18: 8 [IU] via SUBCUTANEOUS
  Administered 2013-08-18: 11 [IU] via SUBCUTANEOUS
  Administered 2013-08-18: 3 [IU] via SUBCUTANEOUS
  Administered 2013-08-19 (×2): 5 [IU] via SUBCUTANEOUS

## 2013-08-17 MED ORDER — SODIUM CHLORIDE 0.9 % IJ SOLN: 3.0000 mL | Freq: Two times a day (BID) | INTRAMUSCULAR | Status: DC

## 2013-08-17 MED ORDER — DEXTROSE 5 % IV SOLN
1.0000 g | Freq: Every day | INTRAVENOUS | Status: DC
  Administered 2013-08-18 (×2): 1 g via INTRAVENOUS
  Filled 2013-08-17 (×3): qty 10

## 2013-08-17 MED ORDER — LEVOFLOXACIN IN D5W 750 MG/150ML IV SOLN
750.0000 mg | Freq: Once | INTRAVENOUS | Status: DC
  Administered 2013-08-17: 750 mg via INTRAVENOUS
  Filled 2013-08-17: qty 150

## 2013-08-17 MED ORDER — SODIUM CHLORIDE 0.9 % IV SOLN
Freq: Once | INTRAVENOUS | Status: AC
  Administered 2013-08-17: 20:00:00 via INTRAVENOUS

## 2013-08-17 MED ORDER — INSULIN ASPART 100 UNIT/ML ~~LOC~~ SOLN
4.0000 [IU] | Freq: Three times a day (TID) | SUBCUTANEOUS | Status: DC
  Administered 2013-08-18 – 2013-08-19 (×5): 4 [IU] via SUBCUTANEOUS

## 2013-08-17 NOTE — H&P (Signed)
[facility] History and Physical  [name] ZOX:[id number] DOB: 1971/05/08    PCP:   Dorrene German, MD   Chief Complaint:  A little confusion, found to be in hyperosmolar state.  HPI: [name] is an 42 y.o. male with hx of DM2, noncompliance with Insulin due to cost constraint, HTN, presents to the ER as he was not taking his insulin, and not feel well.  He was a little confused according to his wife, but no HA, focal neurological symptoms, nausea, vomiting or abdominal pain.   He has no fever, chills, or stiff neck.  He denied any other symptomologies.  Evalaution in the ER showed BS 600, Bicarb 28, normal K, and no leukocytosis.  His UA showed 7-10 WBCs but he had no GU symptoms.  His troponin was negative.  He was given IV fluid and IV levaquin, and hospitalist was asked to admit him for hyperosmolar, nonketotic, hyperglycemia with a possible UTI.  Rewiew of Systems:  Constitutional: Negative for malaise, fever and chills. No significant weight loss or weight gain Eyes: Negative for eye pain, redness and discharge, diplopia, visual changes, or flashes of light. ENMT: Negative for ear pain, hoarseness, nasal congestion, sinus pressure and sore throat. No headaches; tinnitus, drooling, or problem swallowing. Cardiovascular: Negative for chest pain, palpitations, diaphoresis, dyspnea and peripheral edema. ; No orthopnea, PND Respiratory: Negative for cough, hemoptysis, wheezing and stridor. No pleuritic chestpain. Gastrointestinal: Negative for nausea, vomiting, diarrhea, constipation, abdominal pain, melena, blood in stool, hematemesis, jaundice and rectal bleeding.    Genitourinary: Negative for frequency, dysuria, incontinence,flank pain and hematuria; Musculoskeletal: Negative for back pain and neck pain. Negative for swelling and trauma.;  Skin: . Negative for pruritus, rash, abrasions, bruising and skin lesion.; ulcerations Neuro: Negative for headache,  lightheadedness and neck stiffness. Negative for weakness, altered level of consciousness ,  extremity weakness, burning feet, involuntary movement, seizure and syncope.  Psych: negative for anxiety, depression, insomnia, tearfulness, panic attacks, hallucinations, paranoia, suicidal or homicidal ideation    Past Medical History  Diagnosis Date  . Diabetes mellitus   . Hypertension     History reviewed. No pertinent past surgical history.  Medications:  HOME MEDS: Prior to Admission medications   Not on File     Allergies:  No Known Allergies  Social History:   has no tobacco, alcohol, and drug history on file.  Family History: History reviewed. No pertinent family history.   Physical Exam: Filed Vitals:   08/17/13 1659  BP: 140/98  Pulse: 125  Temp: 97.9 F (36.6 C)  TempSrc: Oral  Resp: 20  Weight: 83.66 kg (184 lb 7 oz)  SpO2: 100%   Blood pressure 140/98, pulse 125, temperature 97.9 F (36.6 C), temperature source Oral, resp. rate 20, weight 83.66 kg (184 lb 7 oz), SpO2 100.00%.  GEN:  Pleasant patient lying in the stretcher in no acute distress; cooperative with exam. PSYCH:  alert and oriented x4; does not appear anxious or depressed; affect is appropriate. HEENT: Mucous membranes pink and anicteric; PERRLA; EOM intact; no cervical lymphadenopathy nor thyromegaly or carotid bruit; no JVD; There were no stridor. Neck is very supple. Breasts:: Not examined CHEST WALL: No tenderness CHEST: Normal respiration, clear to auscultation bilaterally.  HEART: Regular rate and rhythm.  There are no murmur, rub, or gallops.   BACK: No kyphosis or scoliosis; no CVA tenderness ABDOMEN: soft and non-tender; no masses, no organomegaly, normal abdominal bowel sounds; no pannus; no intertriginous candida. There is no rebound and  no distention. Rectal Exam: Not done EXTREMITIES: No bone or joint deformity; age-appropriate arthropathy of the hands and knees; no edema; no  ulcerations.  There is no calf tenderness. Genitalia: not examined PULSES: 2+ and symmetric SKIN: Normal hydration no rash or ulceration CNS: Cranial nerves 2-12 grossly intact no focal lateralizing neurologic deficit.  Speech is fluent; uvula elevated with phonation, facial symmetry and tongue midline. DTR are normal bilaterally, cerebella exam is intact, barbinski is negative and strengths are equaled bilaterally.  No sensory loss.   Labs on Admission:  Basic Metabolic Panel:  Recent Labs Lab 08/17/13 1735  NA 122*  K 5.5*  CL 84*  CO2 28  GLUCOSE 675*  BUN 21  CREATININE 1.53*  CALCIUM 9.7   Liver Function Tests:  Recent Labs Lab 08/17/13 1735  AST 14  ALT 44  ALKPHOS 87  BILITOT 0.4  PROT 8.2  ALBUMIN 3.9   No results found for this basename: LIPASE, AMYLASE,  in the last 168 hours No results found for this basename: AMMONIA,  in the last 168 hours CBC:  Recent Labs Lab 08/17/13 1735  WBC 6.9  NEUTROABS 4.7  HGB 14.9  HCT 41.1  MCV 75.7*  PLT 274   Cardiac Enzymes: No results found for this basename: CKTOTAL, CKMB, CKMBINDEX, TROPONINI,  in the last 168 hours  CBG:  Recent Labs Lab 08/17/13 1724 08/17/13 1925 08/17/13 2034  GLUCAP >600* 596* 419*     Radiological Exams on Admission: No results found.  Assessment/Plan Present on Admission:  . Hyperosmolarity syndrome . UTI (urinary tract infection) . HTN (hypertension) . Diabetes  PLAN:  Will admit him for hyperosmolar nonketotic hyperglycemia with possible UTI.  He needs IVF.  Will also give IV Rocephin. I have continued his home meds, and encouraged him to be more compliant with his insulin.  He is very stable and will be admitted to [facility] service.  Thank you for asking to participate in his care.  Other plans as per orders.  Code Status:FULL CODE.   Houston Siren, MD. [facility] Pager [phone] 7pm to 7am.  08/17/2013, 9:42 PM

## 2013-08-17 NOTE — ED Notes (Signed)
Pt c/o hyperglycemia with difficulty controlling x several days; pt sts some dizziness since started; pt sts last reading was high

## 2013-08-17 NOTE — ED Notes (Signed)
Pt's CBG is HIGH. Exceeds measure.

## 2013-08-17 NOTE — ED Provider Notes (Signed)
CSN: [id number]     Arrival date & time 08/17/13  1650 History   First MD Initiated Contact with Patient 08/17/13 1705     Chief Complaint  Patient presents with  . Hyperglycemia   (Consider location/radiation/quality/duration/timing/severity/associated sxs/prior Treatment) HPI Comments: 42 year old male known diabetic. Had 2 days of difficulty controlling blood sugars. Patient reports today's glucometers read "high". Per wife patient is been experiencing waxing and waning confusion. Reports he has been dizzy, headache and fatigue. Endorses nausea no vomiting.  Patient is a 42 y.o. male presenting with hyperglycemia.  Hyperglycemia Blood sugar level PTA:  "high" Severity:  Severe Onset quality:  Gradual Duration:  2 days Timing:  Constant Progression:  Worsening Chronicity:  Recurrent Diabetes status:  Controlled with insulin Current diabetic therapy:  Lantus, novolog Context: not change in medication, not insulin pump use, not new diabetes diagnosis, not noncompliance and not recent illness   Relieved by:  Nothing Ineffective treatments:  Insulin Associated symptoms: altered mental status, blurred vision, confusion, dizziness, fatigue and increased thirst   Associated symptoms: no abdominal pain, no chest pain, no dysuria and no shortness of breath     Past Medical History  Diagnosis Date  . Diabetes mellitus   . Hypertension    History reviewed. No pertinent past surgical history. History reviewed. No pertinent family history. History  Substance Use Topics  . Smoking status: Not on file  . Smokeless tobacco: Not on file  . Alcohol Use: Not on file    Review of Systems  Constitutional: Positive for fatigue.  Eyes: Positive for blurred vision.  Respiratory: Negative for shortness of breath.   Cardiovascular: Negative for chest pain.  Gastrointestinal: Negative for abdominal pain.  Endocrine: Positive for polydipsia.  Genitourinary: Negative for dysuria.  Skin:  Negative for rash.  Neurological: Positive for dizziness and light-headedness.  Psychiatric/Behavioral: Positive for confusion.  All other systems reviewed and are negative.    Allergies  Review of patient's allergies indicates no known allergies.  Home Medications  No current outpatient prescriptions on file. BP 140/98  Pulse 125  Temp(Src) 97.9 F (36.6 C) (Oral)  Resp 20  Wt 184 lb 7 oz (83.66 kg)  SpO2 100% Physical Exam  Nursing note and vitals reviewed. Constitutional: He is oriented to person, place, and time. He appears well-developed and well-nourished.  HENT:  Head: Normocephalic and atraumatic.  Eyes: EOM are normal. Pupils are equal, round, and reactive to light.  Neck: Normal range of motion.  Cardiovascular: Regular rhythm and intact distal pulses.   Slightly tachycardic on exam  Pulmonary/Chest: Effort normal and breath sounds normal. No respiratory distress.  Abdominal: Soft. He exhibits no distension. There is no tenderness. There is no rebound and no guarding.  Musculoskeletal: Normal range of motion.  Neurological: He is alert and oriented to person, place, and time. No cranial nerve deficit. He exhibits normal muscle tone. Coordination normal.  Skin: Skin is warm and dry. No rash noted.  Psychiatric: He has a normal mood and affect.  Per wife is been confused in the waxing and waning basis. On exam patient is alert oriented not confused acutely    ED Course  Procedures (including critical care time) Labs Review Labs Reviewed  CBC WITH DIFFERENTIAL - Abnormal; Notable for the following:    MCV 75.7 (*)    MCHC 36.3 (*)    All other components within normal limits  COMPREHENSIVE METABOLIC PANEL - Abnormal; Notable for the following:    Sodium 122 (*)  Potassium 5.5 (*)    Chloride 84 (*)    Glucose, Bld 675 (*)    Creatinine, Ser 1.53 (*)    GFR calc non Af Amer 55 (*)    GFR calc Af Amer 64 (*)    All other components within normal limits   LACTIC ACID, PLASMA - Abnormal; Notable for the following:    Lactic Acid, Venous 3.1 (*)    All other components within normal limits  URINALYSIS, ROUTINE W REFLEX MICROSCOPIC - Abnormal; Notable for the following:    Glucose, UA >1000 (*)    Nitrite POSITIVE (*)    All other components within normal limits  GLUCOSE, CAPILLARY - Abnormal; Notable for the following:    Glucose-Capillary >600 (*)    All other components within normal limits  GLUCOSE, CAPILLARY - Abnormal; Notable for the following:    Glucose-Capillary 596 (*)    All other components within normal limits  URINE MICROSCOPIC-ADD ON - Abnormal; Notable for the following:    Bacteria, UA MANY (*)    All other components within normal limits  GLUCOSE, CAPILLARY - Abnormal; Notable for the following:    Glucose-Capillary 419 (*)    All other components within normal limits  POCT I-STAT 3, BLOOD GAS (G3P V) - Abnormal; Notable for the following:    pH, Ven 7.386 (*)    pO2, Ven 21.0 (*)    Bicarbonate 29.1 (*)    Acid-Base Excess 3.0 (*)    All other components within normal limits  URINE CULTURE  KETONES, QUALITATIVE  BLOOD GAS, VENOUS   Imaging Review No results found.  EKG Interpretation   None       MDM   1. Hyperosmolarity syndrome    42 year old male diabetic with 2 days if persistently elevated blood sugars with hyperosmolar hyperglycemic nonketotic syndrome. Patient states she's been compliant with his medications however wife questions this in room. Initially found patient to have blood glucose greater than 500. UA showed no ketones. No serum ketones. Not DKA. Patient with CMP showing normal bicarbonate. No other abnormalities corrected for extreme hyperglycemia. UA did show patient with leukocytes and nitrites concerning for UTI. Concern for possibly infectious source causing hyperglycemic state. Patient will multiple liters of fluid with improvement of glucose. However still elevated greater than 400.  Patient started on insulin. Medicine consult and will admit for further treatment. Remained stable in the emergency department.     Bridgett Larsson, MD 08/17/13 2139

## 2013-08-17 NOTE — ED Notes (Signed)
Critical labs reported to Dr.

## 2013-08-18 ENCOUNTER — Encounter (HOSPITAL_COMMUNITY): Payer: Self-pay | Admitting: General Practice

## 2013-08-18 DIAGNOSIS — E1101 Type 2 diabetes mellitus with hyperosmolarity with coma: Principal | ICD-10-CM

## 2013-08-18 LAB — TROPONIN I
Troponin I: <0.3 ng/mL (ref <0.30)
Troponin I: <0.3 ng/mL (ref <0.30)
Troponin I: <0.3 ng/mL (ref <0.30)

## 2013-08-18 LAB — BASIC METABOLIC PANEL
BUN: 16 mg/dL (ref 6–23)
Creatinine, Ser: 1.42 mg/dL — ABNORMAL HIGH (ref 0.50–1.35)
GFR calc Af Amer: 70 mL/min — ABNORMAL LOW (ref >90)
GFR calc non Af Amer: 60 mL/min — ABNORMAL LOW (ref >90)
Glucose, Bld: 422 mg/dL — ABNORMAL HIGH (ref 70–99)
Potassium: 4.7 mEq/L (ref 3.5–5.1)

## 2013-08-18 LAB — GLUCOSE, CAPILLARY: Glucose-Capillary: 162 mg/dL — ABNORMAL HIGH (ref 70–99)

## 2013-08-18 MED ORDER — INSULIN GLARGINE 100 UNIT/ML ~~LOC~~ SOLN
30.0000 [IU] | Freq: Every day | SUBCUTANEOUS | Status: DC
  Administered 2013-08-18 – 2013-08-19 (×2): 30 [IU] via SUBCUTANEOUS
  Filled 2013-08-18 (×3): qty 0.3

## 2013-08-18 MED ORDER — ACETAMINOPHEN 325 MG PO TABS
650.0000 mg | ORAL_TABLET | Freq: Four times a day (QID) | ORAL | Status: DC | PRN
  Administered 2013-08-18: 650 mg via ORAL
  Filled 2013-08-18: qty 2

## 2013-08-18 NOTE — Progress Notes (Signed)
Utilization Review Completed.   Kerrick Miler, RN, BSN Nurse Case Manager  

## 2013-08-18 NOTE — Progress Notes (Signed)
NURSING PROGRESS NOTE  [name] [id number] Admitted to 4U98: 08/18/2013 Attending Provider: Clydia Llano, MD    [name] is a 42 y.o. male patient admitted from ED awake, alert  & orientated  X 4,  Full Code, VSS - Blood pressure 142/110, pulse 98, temperature 97.8 F (36.6 C), temperature source Oral, resp. rate 20, height 6\' 1"  (1.854 m), weight 83.462 kg (184 lb), SpO2 98.00%., O2  Room air, no c/o shortness of breath, no c/o chest pain, no distress noted. Tele #20 placed and pt is currently running:normal sinus rhythm, pulse: 81.   IV site WDL:  Right AC with a transparent dsg that's clean dry and intact, Dated: 11/27. NS@ 150.  Allergies:  No Known Allergies   Past Medical History  Diagnosis Date  . Hypertension   . Type II diabetes mellitus   . Gout     History:  obtained from the patient.  Pt orientation to unit, room and routine. Information packet given to patient/family.  Admission INP armband ID verified with patient/family, and in place. SR up x 2, fall risk assessment complete with Patient and family verbalizing understanding of risks associated with falls. Pt verbalizes an understanding of how to use the call bell and to call for help before getting out of bed.  Skin, clean-dry- intact without evidence of bruising, or skin tears.   No evidence of skin break down noted on exam.    Will cont to monitor and assist as needed.  Ubaldo Glassing, RN 08/18/2013

## 2013-08-18 NOTE — ED Provider Notes (Addendum)
Medical screening examination/treatment/procedure(s) were conducted as a shared visit with non-physician practitioner(s) or resident and myself. I personally evaluated the patient during the encounter and agree with the findings and plan unless otherwise indicated.  I have personally reviewed any xrays and/ or EKG's with the provider and I agree with interpretation.  Worsening glucose the past week, pt does not take insulin regularly, mild confusion recent with glu > 600. Pt has been admitted int he past for similar. No recent infection. Exam AO3, neck supple, abd soft/ NT, no rashes, lungs clear. Fluid boluses. Urine infection  - levaquin. Glu improved in ED.   Plan on insulin stabilizer and recheck.  Hyperglycemia, Uncontrolled DM, HTN, UTI, Dehydration   Enid Skeens, MD 08/18/13 0022  Enid Skeens, MD 08/18/13 [phone]

## 2013-08-18 NOTE — Progress Notes (Signed)
[facility] PROGRESS NOTE  Field Staniszewski WUJ:[id number] DOB: [birth year] DOA: 08/17/2013 PCP: Dorrene German, MD  HPI/Subjective: Wife at bedside, denies any complaints.  Assessment/Plan: Principal Problem:   Hyperosmolarity syndrome Active Problems:   UTI (urinary tract infection)   HTN (hypertension)   Diabetes   Noncompliance   Hyperosmolar hypoglycemic state -Placed on insulin drip, discontinued yesterday. -Now on subcutaneous insulin, Lantus 30 units. -Insulin sliding scale and prandial coverage, check hemoglobin A1c. -Patient reported that she was unable to afford his medication, he recently purchased a health insurance.  UTI -Urinalysis consistent with UTI, patient started on Levaquin empirically. -We'll adjust antibiotics according to culture results.  Hyponatremia -Pseudohyponatremia secondary to the hypoglycemia, corrected sodium is 136-139.  Hypertension -Continue home medications.  Code Status: Full code Family Communication: Plan discussed with the patient. Disposition Plan: Remains inpatient   Consultants:  None  Procedures:  None  Antibiotics:  Levaquin   Objective: Filed Vitals:   08/18/13 0556  BP: 111/82  Pulse: 89  Temp: 98.1 F (36.7 C)  Resp: 19    Intake/Output Summary (Last 24 hours) at 08/18/13 1300 Last data filed at 08/18/13 0700  Gross per 24 hour  Intake   2095 ml  Output      0 ml  Net   2095 ml   Filed Weights   08/17/13 1659 08/17/13 2310  Weight: 83.66 kg (184 lb 7 oz) 83.462 kg (184 lb)    Exam: General: Alert and awake, oriented x3, not in any acute distress. HEENT: anicteric sclera, pupils reactive to light and accommodation, EOMI CVS: S1-S2 clear, no murmur rubs or gallops Chest: clear to auscultation bilaterally, no wheezing, rales or rhonchi Abdomen: soft nontender, nondistended, normal bowel sounds, no organomegaly Extremities: no cyanosis, clubbing or edema noted bilaterally Neuro:  Cranial nerves II-XII intact, no focal neurological deficits  Data Reviewed: Basic Metabolic Panel:  Recent Labs Lab 08/17/13 1735 08/18/13 0945  NA 122* 131*  K 5.5* 4.7  CL 84* 94*  CO2 28 25  GLUCOSE 675* 422*  BUN 21 16  CREATININE 1.53* 1.42*  CALCIUM 9.7 8.7   Liver Function Tests:  Recent Labs Lab 08/17/13 1735  AST 14  ALT 44  ALKPHOS 87  BILITOT 0.4  PROT 8.2  ALBUMIN 3.9   No results found for this basename: LIPASE, AMYLASE,  in the last 168 hours No results found for this basename: AMMONIA,  in the last 168 hours CBC:  Recent Labs Lab 08/17/13 1735  WBC 6.9  NEUTROABS 4.7  HGB 14.9  HCT 41.1  MCV 75.7*  PLT 274   Cardiac Enzymes:  Recent Labs Lab 08/18/13 0019 08/18/13 0630 08/18/13 1137  TROPONINI <0.30 <0.30 <0.30   BNP (last 3 results) No results found for this basename: PROBNP,  in the last 8760 hours CBG:  Recent Labs Lab 08/17/13 2034 08/17/13 2142 08/17/13 2320 08/18/13 0815 08/18/13 1155  GLUCAP 419* 349* 277* 299* 314*    Micro No results found for this or any previous visit (from the past 240 hour(s)).   Studies: No results found.  Scheduled Meds: . cefTRIAXone (ROCEPHIN)  IV  1 g Intravenous QHS  . docusate sodium  100 mg Oral BID  . insulin aspart  0-15 Units Subcutaneous TID WC  . insulin aspart  0-5 Units Subcutaneous QHS  . insulin aspart  4 Units Subcutaneous TID WC  . insulin glargine  30 Units Subcutaneous Daily  . sodium chloride  3 mL Intravenous Q12H   Continuous  Infusions: . sodium chloride 150 mL/hr at 08/18/13 1026       Time spent: 35 minutes    [facility] A  [facility] Pager [phone] If 7PM-7AM, please contact night-coverage at [email or web address], password [facility] 08/18/2013, 1:00 PM  LOS: 1 day

## 2013-08-19 DIAGNOSIS — Z9119 Patient's noncompliance with other medical treatment and regimen: Secondary | ICD-10-CM

## 2013-08-19 LAB — BASIC METABOLIC PANEL
CO2: 25 mEq/L (ref 19–32)
Calcium: 8.7 mg/dL (ref 8.4–10.5)
GFR calc non Af Amer: 68 mL/min — ABNORMAL LOW (ref >90)
Glucose, Bld: 304 mg/dL — ABNORMAL HIGH (ref 70–99)
Potassium: 4.6 mEq/L (ref 3.5–5.1)
Sodium: 131 mEq/L — ABNORMAL LOW (ref 135–145)

## 2013-08-19 LAB — GLUCOSE, CAPILLARY
Glucose-Capillary: 236 mg/dL — ABNORMAL HIGH (ref 70–99)
Glucose-Capillary: 232 mg/dL — ABNORMAL HIGH (ref 70–99)

## 2013-08-19 MED ORDER — SITAGLIPTIN PHOS-METFORMIN HCL 50-1000 MG PO TABS: 1.0000 | ORAL_TABLET | Freq: Two times a day (BID) | ORAL | Status: DC

## 2013-08-19 MED ORDER — CIPROFLOXACIN HCL 500 MG PO TABS: 500.0000 mg | ORAL_TABLET | Freq: Two times a day (BID) | ORAL | Status: DC

## 2013-08-19 MED ORDER — INSULIN GLARGINE 100 UNITS/ML SOLOSTAR PEN: 40.0000 [IU] | PEN_INJECTOR | Freq: Every day | SUBCUTANEOUS | Status: DC

## 2013-08-19 NOTE — Discharge Summary (Signed)
Physician Discharge Summary  [name]:[id number] DOB: [birth year] DOA: 08/17/2013  PCP: Dorrene German, MD  Admit date: 08/17/2013 Discharge date: 08/19/2013  Time spent: 40 minutes  Recommendations for Outpatient Follow-up:  1. Followup with primary care physician within one week  Discharge Diagnoses:  Principal Problem:   HHNC (hyperglycemic hyperosmolar nonketotic coma) Active Problems:   UTI (urinary tract infection)   HTN (hypertension)   Diabetes   Noncompliance   Discharge Condition: Stable  Diet recommendation: Carb modified diet  Filed Weights   08/17/13 1659 08/17/13 2310  Weight: 83.66 kg (184 lb 7 oz) 83.462 kg (184 lb)    History of present illness:  [name] is an 42 y.o. male with hx of DM2, noncompliance with Insulin due to cost constraint, HTN, presents to the ER as he was not taking his insulin, and not feel well. He was a little confused according to his wife, but no HA, focal neurological symptoms, nausea, vomiting or abdominal pain. He has no fever, chills, or stiff neck. He denied any other symptomologies. Evalaution in the ER showed BS 600, Bicarb 28, normal K, and no leukocytosis. His UA showed 7-10 WBCs but he had no GU symptoms. His troponin was negative. He was given IV fluid and IV levaquin, and hospitalist was asked to admit him for hyperosmolar, nonketotic, hyperglycemia with a possible UTI.  Hospital Course:   1. Hyperosmolar hyperglycemic state: Patient presented to the hospital with blood sugar of 635, there was no evidence of acidosis with bicarbonate of 27. Patient started on insulin drip briefly, blood sugar dropped to about 200 then started on subcutaneous insulin. Patient started on Lantus 30 units and insulin sliding scale. Hemoglobin A1c was checked but was pending at the time of discharge. Patient reported was unable to afford his diabetes medication that is why he ended up with such high blood sugar. Reported now that he  establish new health insurance coverage, Janumet and Lantus insulin prescribed as requested by the patient. Insulin dose increased to 40 units.  2. UTI: Urinalysis consistent with UTI, patient started on Rocephin on admission. On day of discharge culture was not back to ciprofloxacin 500 mg by mouth twice a day for 5 more days was prescribed.  3. Hyponatremia: Pseudohyponatremia secondary to hyperglycemia, correct his sodium is 136-139.   Procedures:  None  Consultations:  None  Discharge Exam: Filed Vitals:   08/19/13 0529  BP: 107/70  Pulse: 80  Temp: 98 F (36.7 C)  Resp: 18   General: Alert and awake, oriented x3, not in any acute distress. HEENT: anicteric sclera, pupils reactive to light and accommodation, EOMI CVS: S1-S2 clear, no murmur rubs or gallops Chest: clear to auscultation bilaterally, no wheezing, rales or rhonchi Abdomen: soft nontender, nondistended, normal bowel sounds, no organomegaly Extremities: no cyanosis, clubbing or edema noted bilaterally Neuro: Cranial nerves II-XII intact, no focal neurological deficits  Discharge Instructions      Discharge Orders   Future Orders Complete By Expires   Diet Carb Modified  As directed        Medication List         ciprofloxacin 500 MG tablet  Commonly known as:  CIPRO  Take 1 tablet (500 mg total) by mouth 2 (two) times daily.     insulin glargine 100 units/mL Soln  Commonly known as:  LANTUS  Inject 40 Units into the skin at bedtime.     sitaGLIPtin-metformin 50-1000 MG per tablet  Commonly known as:  JANUMET  Take 1 tablet by mouth 2 (two) times daily with a meal.       No Known Allergies    The results of significant diagnostics from this hospitalization (including imaging, microbiology, ancillary and laboratory) are listed below for reference.    Significant Diagnostic Studies: No results found.  Microbiology: No results found for this or any previous visit (from the past 240  hour(s)).   Labs: Basic Metabolic Panel:  Recent Labs Lab 08/17/13 1735 08/18/13 0945 08/19/13 0925  NA 122* 131* 131*  K 5.5* 4.7 4.6  CL 84* 94* 98  CO2 28 25 25   GLUCOSE 675* 422* 304*  BUN 21 16 16   CREATININE 1.53* 1.42* 1.28  CALCIUM 9.7 8.7 8.7   Liver Function Tests:  Recent Labs Lab 08/17/13 1735  AST 14  ALT 44  ALKPHOS 87  BILITOT 0.4  PROT 8.2  ALBUMIN 3.9   No results found for this basename: LIPASE, AMYLASE,  in the last 168 hours No results found for this basename: AMMONIA,  in the last 168 hours CBC:  Recent Labs Lab 08/17/13 1735  WBC 6.9  NEUTROABS 4.7  HGB 14.9  HCT 41.1  MCV 75.7*  PLT 274   Cardiac Enzymes:  Recent Labs Lab 08/18/13 0019 08/18/13 0630 08/18/13 1137  TROPONINI <0.30 <0.30 <0.30   BNP: BNP (last 3 results) No results found for this basename: PROBNP,  in the last 8760 hours CBG:  Recent Labs Lab 08/18/13 0815 08/18/13 1155 08/18/13 1724 08/18/13 2247 08/19/13 0800  GLUCAP 299* 314* 162* 197* 236*       Signed:  Norelle Runnion A  [facility] 08/19/2013, 11:33 AM

## 2013-08-20 LAB — URINE CULTURE: Colony Count: >=100000

## 2013-11-27 ENCOUNTER — Emergency Department (HOSPITAL_COMMUNITY): Payer: Managed Care, Other (non HMO)

## 2013-11-27 ENCOUNTER — Encounter (HOSPITAL_COMMUNITY): Payer: Self-pay | Admitting: Emergency Medicine

## 2013-11-27 ENCOUNTER — Emergency Department (HOSPITAL_COMMUNITY)
Admission: EM | Admit: 2013-11-27 | Discharge: 2013-11-28 | Disposition: A | Discharge status: Home / Self Care | Payer: Managed Care, Other (non HMO) | Attending: Emergency Medicine | Admitting: Emergency Medicine

## 2013-11-27 DIAGNOSIS — E119 Type 2 diabetes mellitus without complications: Secondary | ICD-10-CM | POA: Insufficient documentation

## 2013-11-27 DIAGNOSIS — M109 Gout, unspecified: Secondary | ICD-10-CM | POA: Insufficient documentation

## 2013-11-27 DIAGNOSIS — I10 Essential (primary) hypertension: Secondary | ICD-10-CM | POA: Insufficient documentation

## 2013-11-27 DIAGNOSIS — Z794 Long term (current) use of insulin: Secondary | ICD-10-CM | POA: Insufficient documentation

## 2013-11-27 DIAGNOSIS — S52122A Displaced fracture of head of left radius, initial encounter for closed fracture: Secondary | ICD-10-CM

## 2013-11-27 DIAGNOSIS — Y92410 Unspecified street and highway as the place of occurrence of the external cause: Secondary | ICD-10-CM | POA: Insufficient documentation

## 2013-11-27 DIAGNOSIS — Y9389 Activity, other specified: Secondary | ICD-10-CM | POA: Insufficient documentation

## 2013-11-27 DIAGNOSIS — S52123A Displaced fracture of head of unspecified radius, initial encounter for closed fracture: Secondary | ICD-10-CM | POA: Insufficient documentation

## 2013-11-27 MED ORDER — ACETAMINOPHEN-CODEINE #3 300-30 MG PO TABS
1.0000 | ORAL_TABLET | Freq: Once | ORAL | Status: AC
  Administered 2013-11-27: 1 via ORAL
  Filled 2013-11-27: qty 1

## 2013-11-27 NOTE — ED Notes (Signed)
Pt still in pain: PA aware.  

## 2013-11-27 NOTE — ED Notes (Signed)
Patient transported to X-ray 
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
## 2013-11-27 NOTE — ED Notes (Signed)
PA at bedside.
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
## 2013-11-28 MED ORDER — IBUPROFEN 800 MG PO TABS: 800.0000 mg | ORAL_TABLET | Freq: Three times a day (TID) | ORAL | Status: DC

## 2013-11-28 MED ORDER — OXYCODONE-ACETAMINOPHEN 5-325 MG PO TABS: 1.0000 | ORAL_TABLET | Freq: Four times a day (QID) | ORAL | Status: DC | PRN

## 2013-11-28 NOTE — ED Notes (Signed)
Ortho paged. 

## 2013-11-28 NOTE — Discharge Instructions (Signed)
You have an arm fracture in your left radial bone. You will need to wear the splint applied in the ER until seen by Dr. Lenon Curt. Please call his office tomorrow to schedule a follow up appointment. Please take pain medication and/or muscle relaxants as prescribed and as needed for pain. Please do not drive on narcotic pain medication or on muscle relaxants. Please read all discharge instructions and return precautions.   Radial Head Fracture A radial head fracture is a break of the smaller bone (radius) in the forearm. The head of this bone is the part near the elbow. These fractures commonly happen during a fall when you land on the outstretched arm. These fractures are more common in middle aged adults and are common with a dislocation of the elbow. SYMPTOMS   Swelling of the elbow joint and pain on the outside of the elbow.  Pain and difficulty in bending or straightening the elbow.  Pain and difficulty in turning the palm of the hand up or down with the elbow bent. DIAGNOSIS  Your caregiver may make this diagnosis by a physical exam. X-rays can confirm the type and amount of break. Sometimes a break which is not displaced cannot be seen on the original x-ray. TREATMENT  Radial head fractures are classified according to the amount of movement (displacement) of parts from the normal position.  Type 1 Fractures  Type 1 fractures are generally small fractures in which bone pieces remain together (non-displaced fracture).  The fracture may not be seen on initial X-rays. Usually if x-rays are repeated two to three weeks later, the fracture will show up. A splint or sling is used for a few days. Gentle early motion is used to prevent the elbow from becoming stiff. It should not be done vigorously or forced as this could displace the bone pieces. Type 2 Fractures  With type 2 fractures, bone pieces are slightly displaced and larger pieces of bone are broken off.  If only a little displacement of  the bone piece is present, splinting for 4 to 5 days usually works well. This is again followed with gentle active range of motion. Small fragments may be surgically removed.  Large pieces of bone that can be put back into place will sometimes be fixed with pins or screws to hold them until the bone is healed. If this cannot be done, the fragments are removed. For older, less active people, sometimes the entire radial head is removed if the wrist is not injured. The elbow and arm will still work fine. Soft tissue, tendon, and ligament injuries are corrected at the same time. Type 3 Fractures  Type 3 fractures have multiple broken pieces of bone which cannot be fixed. Surgery is usually needed to remove the broken bits of bone and what is left of the radial head. Soft-tissue damage is repaired. Gentle early motion is used to prevent the elbow from becoming stiff. Sometimes an artificial radial head can be used to prevent deformity if elbow is instable. Rest, ice, elevation, immobilization, medications, and pain control are used in the early care. HOME CARE INSTRUCTIONS   Keep the injured part elevated while sitting or lying down. Keep the injury above the level of your heart (the center of the chest). This will decrease swelling and pain.  Apply ice to the injury for 15-20 minutes, 03-04 times per day while awake, for 2 days. Put the ice in a plastic bag and place a towel between the bag of ice and  your cast or splint.  Move your fingers to avoid stiffness and minimize swelling.  If you have a plaster or fiberglass cast:  Do not try to scratch the skin under the cast using sharp or pointed objects.  Check the skin around the cast every day. You may put lotion on any red or sore areas.  Keep your cast dry and clean.  If you have a plaster splint:  Wear the splint as directed.  You may loosen the elastic around the splint if your fingers become numb, tingle, or turn cold or blue.  Do not  put pressure on any part of your cast or splint. It may break. Rest your cast only on a pillow for the first 24 hours until it is fully hardened.  Your cast or splint can be protected during bathing with a plastic bag. Do not lower the cast or splint into water.  Only take over-the-counter or prescription medicines for pain, discomfort, or fever as directed by your caregiver.  Follow all instructions for follow up with your caregiver. This includes any orthopedic referrals, physical therapy and rehabilitation. Any delay in obtaining necessary care could result in a delay or failure of the bones to heal or permanent elbow stiffness.  Do not over do exercises. This could further damage your injury. SEEK IMMEDIATE MEDICAL CARE IF:   Your cast or splint gets damaged or breaks.  You have more severe pain or swelling than you did before getting the cast.  You have severe pain when stretching your fingers.  There is a bad smell, new stains and/or pus-like (purulent) drainage coming from under the cast.  Your fingers or hand turn pale or blue, become cold, or you lose feeling. Document Released: 06/28/2006 Document Revised: 11/29/2011 Document Reviewed: 08/05/2009 [facility] Patient Information 2014 [place].  Cast or Splint Care Casts and splints support injured limbs and keep bones from moving while they heal. It is important to care for your cast or splint at home.  HOME CARE INSTRUCTIONS  Keep the cast or splint uncovered during the drying period. It can take 24 to 48 hours to dry if it is made of plaster. A fiberglass cast will dry in less than 1 hour.  Do not rest the cast on anything harder than a pillow for the first 24 hours.  Do not put weight on your injured limb or apply pressure to the cast until your health care provider gives you permission.  Keep the cast or splint dry. Wet casts or splints can lose their shape and may not support the limb as well. A wet cast that has  lost its shape can also create harmful pressure on your skin when it dries. Also, wet skin can become infected.  Cover the cast or splint with a plastic bag when bathing or when out in the rain or snow. If the cast is on the trunk of the body, take sponge baths until the cast is removed.  If your cast does become wet, dry it with a towel or a blow dryer on the cool setting only.  Keep your cast or splint clean. Soiled casts may be wiped with a moistened cloth.  Do not place any hard or soft foreign objects under your cast or splint, such as cotton, toilet paper, lotion, or powder.  Do not try to scratch the skin under the cast with any object. The object could get stuck inside the cast. Also, scratching could lead to an infection. If itching  is a problem, use a blow dryer on a cool setting to relieve discomfort.  Do not trim or cut your cast or remove padding from inside of it.  Exercise all joints next to the injury that are not immobilized by the cast or splint. For example, if you have a long leg cast, exercise the hip joint and toes. If you have an arm cast or splint, exercise the shoulder, elbow, thumb, and fingers.  Elevate your injured arm or leg on 1 or 2 pillows for the first 1 to 3 days to decrease swelling and pain.It is best if you can comfortably elevate your cast so it is higher than your heart. SEEK MEDICAL CARE IF:   Your cast or splint cracks.  Your cast or splint is too tight or too loose.  You have unbearable itching inside the cast.  Your cast becomes wet or develops a soft spot or area.  You have a bad smell coming from inside your cast.  You get an object stuck under your cast.  Your skin around the cast becomes red or raw.  You have new pain or worsening pain after the cast has been applied. SEEK IMMEDIATE MEDICAL CARE IF:   You have fluid leaking through the cast.  You are unable to move your fingers or toes.  You have discolored (blue or white), cool,  painful, or very swollen fingers or toes beyond the cast.  You have tingling or numbness around the injured area.  You have severe pain or pressure under the cast.  You have any difficulty with your breathing or have shortness of breath.  You have chest pain. Document Released: 09/03/2000 Document Revised: 06/27/2013 Document Reviewed: 03/15/2013 [facility] Patient Information 2014 [place].

## 2013-11-28 NOTE — ED Notes (Signed)
Taxi called for the pt, self-pay, waiting in the waiting room.

## 2013-11-28 NOTE — Progress Notes (Signed)
Orthopedic Tech Progress Note Patient Details:  [name] [birth year] [id number]  Ortho Devices Type of Ortho Device: Arm sling;Long arm splint   [name] 11/28/2013, 12:00 AM

## 2013-11-28 NOTE — ED Provider Notes (Signed)
Medical screening examination/treatment/procedure(s) were performed by non-physician practitioner and as supervising physician I was immediately available for consultation/collaboration.   EKG Interpretation None        Charles B. Sheldon, MD 11/28/13 1018 

## 2015-01-12 ENCOUNTER — Emergency Department (HOSPITAL_COMMUNITY): Payer: 59

## 2015-01-12 ENCOUNTER — Inpatient Hospital Stay (HOSPITAL_COMMUNITY)
Admission: EM | Admit: 2015-01-12 | Discharge: 2015-01-16 | DRG: 101 | Disposition: A | Discharge status: Home / Self Care | Payer: 59 | Attending: Internal Medicine | Admitting: Internal Medicine

## 2015-01-12 DIAGNOSIS — N529 Male erectile dysfunction, unspecified: Secondary | ICD-10-CM | POA: Diagnosis present

## 2015-01-12 DIAGNOSIS — G43109 Migraine with aura, not intractable, without status migrainosus: Secondary | ICD-10-CM | POA: Diagnosis present

## 2015-01-12 DIAGNOSIS — Z794 Long term (current) use of insulin: Secondary | ICD-10-CM | POA: Diagnosis not present

## 2015-01-12 DIAGNOSIS — E871 Hypo-osmolality and hyponatremia: Secondary | ICD-10-CM | POA: Diagnosis present

## 2015-01-12 DIAGNOSIS — R569 Unspecified convulsions: Secondary | ICD-10-CM | POA: Diagnosis present

## 2015-01-12 DIAGNOSIS — N179 Acute kidney failure, unspecified: Secondary | ICD-10-CM | POA: Diagnosis present

## 2015-01-12 DIAGNOSIS — I129 Hypertensive chronic kidney disease with stage 1 through stage 4 chronic kidney disease, or unspecified chronic kidney disease: Secondary | ICD-10-CM | POA: Diagnosis present

## 2015-01-12 DIAGNOSIS — N182 Chronic kidney disease, stage 2 (mild): Secondary | ICD-10-CM | POA: Diagnosis present

## 2015-01-12 DIAGNOSIS — E860 Dehydration: Secondary | ICD-10-CM | POA: Diagnosis present

## 2015-01-12 DIAGNOSIS — E1165 Type 2 diabetes mellitus with hyperglycemia: Secondary | ICD-10-CM | POA: Diagnosis present

## 2015-01-12 DIAGNOSIS — E1140 Type 2 diabetes mellitus with diabetic neuropathy, unspecified: Secondary | ICD-10-CM | POA: Diagnosis present

## 2015-01-12 DIAGNOSIS — M109 Gout, unspecified: Secondary | ICD-10-CM | POA: Diagnosis present

## 2015-01-12 DIAGNOSIS — Z9119 Patient's noncompliance with other medical treatment and regimen: Secondary | ICD-10-CM | POA: Diagnosis present

## 2015-01-12 DIAGNOSIS — I10 Essential (primary) hypertension: Secondary | ICD-10-CM | POA: Diagnosis present

## 2015-01-12 DIAGNOSIS — E1142 Type 2 diabetes mellitus with diabetic polyneuropathy: Secondary | ICD-10-CM | POA: Diagnosis present

## 2015-01-12 DIAGNOSIS — N189 Chronic kidney disease, unspecified: Secondary | ICD-10-CM | POA: Diagnosis present

## 2015-01-12 DIAGNOSIS — G40209 Localization-related (focal) (partial) symptomatic epilepsy and epileptic syndromes with complex partial seizures, not intractable, without status epilepticus: Secondary | ICD-10-CM | POA: Diagnosis present

## 2015-01-12 DIAGNOSIS — D352 Benign neoplasm of pituitary gland: Secondary | ICD-10-CM | POA: Diagnosis present

## 2015-01-12 DIAGNOSIS — IMO0002 Reserved for concepts with insufficient information to code with codable children: Secondary | ICD-10-CM | POA: Diagnosis present

## 2015-01-12 DIAGNOSIS — Z91199 Patient's noncompliance with other medical treatment and regimen due to unspecified reason: Secondary | ICD-10-CM

## 2015-01-12 DIAGNOSIS — G43909 Migraine, unspecified, not intractable, without status migrainosus: Secondary | ICD-10-CM | POA: Diagnosis not present

## 2015-01-12 DIAGNOSIS — R4182 Altered mental status, unspecified: Secondary | ICD-10-CM

## 2015-01-12 LAB — CBC WITH DIFFERENTIAL/PLATELET
Basophils Absolute: 0 10*3/uL (ref 0.0–0.1)
Basophils Relative: 1 % (ref 0–1)
EOS ABS: 0.1 10*3/uL (ref 0.0–0.7)
Eosinophils Relative: 2 % (ref 0–5)
HEMATOCRIT: 39.7 % (ref 39.0–52.0)
Hemoglobin: 13.8 g/dL (ref 13.0–17.0)
LYMPHS ABS: 2.4 10*3/uL (ref 0.7–4.0)
LYMPHS PCT: 39 % (ref 12–46)
MCH: 26.8 pg (ref 26.0–34.0)
MCHC: 34.8 g/dL (ref 30.0–36.0)
MCV: 77.2 fL — ABNORMAL LOW (ref 78.0–100.0)
Monocytes Absolute: 0.5 10*3/uL (ref 0.1–1.0)
Monocytes Relative: 7 % (ref 3–12)
NEUTROS ABS: 3.1 10*3/uL (ref 1.7–7.7)
NEUTROS PCT: 51 % (ref 43–77)
Platelets: 252 10*3/uL (ref 150–400)
RBC: 5.14 MIL/uL (ref 4.22–5.81)
RDW: 13.3 % (ref 11.5–15.5)
WBC: 6.1 10*3/uL (ref 4.0–10.5)

## 2015-01-12 LAB — BLOOD GAS, VENOUS
ACID-BASE DEFICIT: 1 mmol/L (ref 0.0–2.0)
BICARBONATE: 24.4 meq/L — AB (ref 20.0–24.0)
FIO2: 0.21 %
O2 Saturation: 70.1 %
PH VEN: 7.359 — AB (ref 7.250–7.300)
Patient temperature: 97.3
TCO2: 21.8 mmol/L (ref 0–100)
pCO2, Ven: 43.9 mmHg — ABNORMAL LOW (ref 45.0–50.0)
pO2, Ven: 35.7 mmHg (ref 30.0–45.0)

## 2015-01-12 LAB — COMPREHENSIVE METABOLIC PANEL
ALT: 30 U/L (ref 0–53)
AST: 22 U/L (ref 0–37)
Albumin: 3.7 g/dL (ref 3.5–5.2)
Alkaline Phosphatase: 73 U/L (ref 39–117)
Anion gap: 10 (ref 5–15)
BILIRUBIN TOTAL: 0.3 mg/dL (ref 0.3–1.2)
BUN: 23 mg/dL (ref 6–23)
CALCIUM: 9.2 mg/dL (ref 8.4–10.5)
CO2: 21 mmol/L (ref 19–32)
Chloride: 96 mmol/L (ref 96–112)
Creatinine, Ser: 1.68 mg/dL — ABNORMAL HIGH (ref 0.50–1.35)
GFR, EST AFRICAN AMERICAN: 56 mL/min — AB (ref >90)
GFR, EST NON AFRICAN AMERICAN: 49 mL/min — AB (ref >90)
Glucose, Bld: 496 mg/dL — ABNORMAL HIGH (ref 70–99)
Potassium: 4.6 mmol/L (ref 3.5–5.1)
Sodium: 127 mmol/L — ABNORMAL LOW (ref 135–145)
TOTAL PROTEIN: 7.6 g/dL (ref 6.0–8.3)

## 2015-01-12 LAB — URINALYSIS, ROUTINE W REFLEX MICROSCOPIC
Bilirubin Urine: NEGATIVE
Glucose, UA: >1000 mg/dL — AB
HGB URINE DIPSTICK: NEGATIVE
Ketones, ur: NEGATIVE mg/dL
LEUKOCYTES UA: NEGATIVE
NITRITE: NEGATIVE
Protein, ur: NEGATIVE mg/dL
Specific Gravity, Urine: 1.032 — ABNORMAL HIGH (ref 1.005–1.030)
UROBILINOGEN UA: 0.2 mg/dL (ref 0.0–1.0)
pH: 5.5 (ref 5.0–8.0)

## 2015-01-12 LAB — MAGNESIUM: Magnesium: 2.1 mg/dL (ref 1.5–2.5)

## 2015-01-12 LAB — CBG MONITORING, ED
GLUCOSE-CAPILLARY: 385 mg/dL — AB (ref 70–99)
Glucose-Capillary: 489 mg/dL — ABNORMAL HIGH (ref 70–99)

## 2015-01-12 LAB — PHOSPHORUS: Phosphorus: 3.9 mg/dL (ref 2.3–4.6)

## 2015-01-12 LAB — URINE MICROSCOPIC-ADD ON

## 2015-01-12 MED ORDER — SODIUM CHLORIDE 0.9 % IV SOLN
INTRAVENOUS | Status: DC
  Administered 2015-01-12: 21:00:00 via INTRAVENOUS

## 2015-01-12 MED ORDER — INSULIN GLARGINE 100 UNIT/ML ~~LOC~~ SOLN
40.0000 [IU] | Freq: Every day | SUBCUTANEOUS | Status: DC
  Administered 2015-01-13 (×2): 40 [IU] via SUBCUTANEOUS
  Filled 2015-01-12 (×3): qty 0.4

## 2015-01-12 MED ORDER — SODIUM CHLORIDE 0.9 % IV BOLUS (SEPSIS)
2000.0000 mL | Freq: Once | INTRAVENOUS | Status: AC
  Administered 2015-01-12: 2000 mL via INTRAVENOUS

## 2015-01-12 MED ORDER — INSULIN ASPART 100 UNIT/ML ~~LOC~~ SOLN
0.0000 [IU] | Freq: Three times a day (TID) | SUBCUTANEOUS | Status: DC
  Administered 2015-01-13 (×2): 2 [IU] via SUBCUTANEOUS
  Administered 2015-01-13 – 2015-01-15 (×3): 3 [IU] via SUBCUTANEOUS
  Administered 2015-01-15: 2 [IU] via SUBCUTANEOUS
  Administered 2015-01-15: 3 [IU] via SUBCUTANEOUS
  Administered 2015-01-16: 2 [IU] via SUBCUTANEOUS

## 2015-01-12 MED ORDER — INSULIN ASPART 100 UNIT/ML ~~LOC~~ SOLN
0.0000 [IU] | Freq: Every day | SUBCUTANEOUS | Status: DC
  Administered 2015-01-13: 5 [IU] via SUBCUTANEOUS
  Administered 2015-01-13: 2 [IU] via SUBCUTANEOUS
  Administered 2015-01-14: 3 [IU] via SUBCUTANEOUS

## 2015-01-12 MED ORDER — INSULIN ASPART 100 UNIT/ML ~~LOC~~ SOLN
12.0000 [IU] | Freq: Once | SUBCUTANEOUS | Status: AC
  Administered 2015-01-12: 12 [IU] via SUBCUTANEOUS
  Filled 2015-01-12: qty 1

## 2015-01-12 MED ORDER — SODIUM CHLORIDE 0.9 % IV SOLN
500.0000 mg | Freq: Once | INTRAVENOUS | Status: AC
  Administered 2015-01-12: 500 mg via INTRAVENOUS
  Filled 2015-01-12: qty 5

## 2015-01-12 MED ORDER — ENOXAPARIN SODIUM 40 MG/0.4ML ~~LOC~~ SOLN
40.0000 mg | Freq: Every day | SUBCUTANEOUS | Status: DC
  Administered 2015-01-13 – 2015-01-15 (×4): 40 mg via SUBCUTANEOUS
  Filled 2015-01-12 (×5): qty 0.4

## 2015-01-12 NOTE — ED Notes (Signed)
Pt had two 'episodes' witnessed by RN in which he is gazing to the left, unable to look at anything else, but still able to speak and answer questions. Each episode lasted approx. 1.5 minutes. MD Zenia Resides aware. Keppra infusing now.

## 2015-01-12 NOTE — ED Notes (Signed)
He states that yesterday evening he became confused and was unable to track with his gaze for a time.  He c/o persistent mild occipital h/a which persists.  He is oriented x 4 with clear speech. PEARL at 52mm.

## 2015-01-12 NOTE — ED Notes (Signed)
MD at bedside. 

## 2015-01-12 NOTE — ED Notes (Signed)
Patient transported to CT 

## 2015-01-12 NOTE — ED Notes (Signed)
Bed: EQ68 Expected date:  Expected time:  Means of arrival:  Comments: Hold for [employer]
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
## 2015-01-13 ENCOUNTER — Inpatient Hospital Stay (HOSPITAL_COMMUNITY): Payer: 59

## 2015-01-13 ENCOUNTER — Encounter (HOSPITAL_COMMUNITY): Payer: Self-pay | Admitting: *Deleted

## 2015-01-13 ENCOUNTER — Inpatient Hospital Stay (HOSPITAL_COMMUNITY)
Admit: 2015-01-13 | Discharge: 2015-01-13 | Disposition: A | Discharge status: Home / Self Care | Payer: 59 | Attending: Neurology | Admitting: Neurology

## 2015-01-13 DIAGNOSIS — N189 Chronic kidney disease, unspecified: Secondary | ICD-10-CM

## 2015-01-13 DIAGNOSIS — N179 Acute kidney failure, unspecified: Secondary | ICD-10-CM | POA: Diagnosis present

## 2015-01-13 DIAGNOSIS — E871 Hypo-osmolality and hyponatremia: Secondary | ICD-10-CM | POA: Diagnosis present

## 2015-01-13 LAB — GLUCOSE, CAPILLARY
Glucose-Capillary: 190 mg/dL — ABNORMAL HIGH (ref 70–99)
Glucose-Capillary: 250 mg/dL — ABNORMAL HIGH (ref 70–99)
Glucose-Capillary: 171 mg/dL — ABNORMAL HIGH (ref 70–99)
Glucose-Capillary: 204 mg/dL — ABNORMAL HIGH (ref 70–99)

## 2015-01-13 LAB — BASIC METABOLIC PANEL
Anion gap: 4 — ABNORMAL LOW (ref 5–15)
BUN: 20 mg/dL (ref 6–23)
CO2: 24 mmol/L (ref 19–32)
CREATININE: 1.49 mg/dL — AB (ref 0.50–1.35)
Calcium: 8.5 mg/dL (ref 8.4–10.5)
Chloride: 107 mmol/L (ref 96–112)
GFR calc Af Amer: 65 mL/min — ABNORMAL LOW (ref >90)
GFR calc non Af Amer: 56 mL/min — ABNORMAL LOW (ref >90)
GLUCOSE: 193 mg/dL — AB (ref 70–99)
POTASSIUM: 3.6 mmol/L (ref 3.5–5.1)
Sodium: 135 mmol/L (ref 135–145)

## 2015-01-13 LAB — LIPID PANEL
Cholesterol: 238 mg/dL — ABNORMAL HIGH (ref 0–200)
HDL: 31 mg/dL — ABNORMAL LOW (ref >39)
LDL Cholesterol: UNDETERMINED mg/dL (ref 0–99)
TRIGLYCERIDES: 566 mg/dL — AB (ref <150)
Total CHOL/HDL Ratio: 7.7 RATIO
VLDL: UNDETERMINED mg/dL (ref 0–40)

## 2015-01-13 LAB — TSH: TSH: 0.788 u[IU]/mL (ref 0.350–4.500)

## 2015-01-13 MED ORDER — ACETAMINOPHEN 325 MG PO TABS
650.0000 mg | ORAL_TABLET | Freq: Four times a day (QID) | ORAL | Status: DC | PRN
  Administered 2015-01-13 – 2015-01-16 (×6): 650 mg via ORAL
  Filled 2015-01-13 (×6): qty 2

## 2015-01-13 MED ORDER — LEVETIRACETAM 500 MG PO TABS
500.0000 mg | ORAL_TABLET | Freq: Two times a day (BID) | ORAL | Status: DC
  Administered 2015-01-14 – 2015-01-16 (×5): 500 mg via ORAL
  Filled 2015-01-13 (×6): qty 1

## 2015-01-13 NOTE — Progress Notes (Signed)
NEURO HOSPITALIST PROGRESS NOTE   SUBJECTIVE:                                                                                                                        No further seizures noted. Patient offers no neurological complains. Tolerating well keppra. MRI brain without contrast was personally reviewed and showed findings consistent with a chronic infarct left frontal lobe just above the orbit that corresponds to the CT hypodensity. Small chronic infarct in the high right frontal cortex. Small chronic infarct left cerebellum. 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 There is mild mass-effect on the left optic nerve and chiasm.  OBJECTIVE:                                                                                                                           Vital signs in last 24 hours: Temp:  [97.3 F (36.3 C)-98.2 F (36.8 C)] 98.2 F (36.8 C) (04/25 0600) Pulse Rate:  [73-114] 91 (04/25 0600) Resp:  [15-20] 16 (04/25 0600) BP: (105-134)/(69-98) 105/69 mmHg (04/25 0600) SpO2:  [98 %-100 %] 100 % (04/25 0600) Weight:  [90.7 kg (199 lb 15.3 oz)] 90.7 kg (199 lb 15.3 oz) (04/25 0006)  Intake/Output from previous day:   Intake/Output this shift:   Nutritional status: Diet Carb Modified Fluid consistency:: Thin; Room service appropriate?: Yes  Past Medical History  Diagnosis Date  . Hypertension   . Type II diabetes mellitus   . Gout     Physical exam: pleasant male in no apparent distress. Head: normocephalic. Neck: supple, no bruits, no JVD. Cardiac: no murmurs. Lungs: clear. Abdomen: soft, no tender, no mass. Extremities: no edema. Skin: no rash   Neurologic Exam:  Mental Status: Alert, oriented, thought content appropriate. Speech fluent without evidence of aphasia. Able to follow 3 step commands without difficulty. Cranial Nerves: II: Discs flat bilaterally;  Visual fields grossly normal, pupils equal, round, reactive to light and accommodation III,IV, VI: ptosis not present, extra-ocular motions intact bilaterally V,VII: smile symmetric, facial light touch sensation normal bilaterally VIII: hearing normal bilaterally IX,X: gag reflex present XI: bilateral shoulder shrug XII: midline tongue extension Motor: Right :Upper extremity 5/5Left: Upper extremity  5/5 Lower extremity 5/5Lower extremity 5/5 Tone and bulk:normal tone throughout; no atrophy noted Sensory: Pinprick and light touch intact throughout, bilaterally Deep Tendon Reflexes: 2+ in the upper extremities and absent in the lower extremities Plantars: Right: downgoingLeft: downgoing Cerebellar: normal finger-to-nose and normal heel-to-shin testing bilaterally Gait: not tested secondary to seizure precautions  Lab Results: Lab Results  Component Value Date/Time   CHOL 238* 01/13/2015 05:54 AM   Lipid Panel  Recent Labs  01/13/15 0554  CHOL 238*  TRIG 566*  HDL 31*  CHOLHDL 7.7  VLDL UNABLE TO CALCULATE IF TRIGLYCERIDE OVER 400 mg/dL  LDLCALC UNABLE TO CALCULATE IF TRIGLYCERIDE OVER 400 mg/dL    Studies/Results: Ct Head Wo Contrast  01/12/2015   CLINICAL DATA:  Initial evaluation for altered mental status, confusion, headache, seizure activity.  EXAM: CT HEAD WITHOUT CONTRAST  TECHNIQUE: Contiguous axial images were obtained from the base of the skull through the vertex without intravenous contrast.  COMPARISON:  None available.  FINDINGS: Cerebral volume within normal limits for age. No significant white matter changes present.  There is asymmetric wedge-shaped hypodensity involving the peripheral aspect of the anterior left frontal lobe (series 2, image 12). While this finding may reflect volume averaging within adjacent cortical  sulcus, possible acute ischemia is not entirely excluded.  No mass lesion or midline shift. No hydrocephalus. No extra-axial fluid collection.  Scalp soft tissues within normal limits. No acute abnormality about the orbits.  Paranasal sinuses and mastoid air cells are clear.  Calvarium intact.  IMPRESSION: 1. Asymmetric hypodensity involving the cortical gray matter in the anterior left frontal lobe as above. This finding is indeterminate, and may reflect volume averaging with an adjacent cortical sulcus, although possible acute ischemia is not entirely excluded. Further evaluation with brain MRI is recommended if there is clinical concern for possible ischemia in this distribution. 2. No other acute intracranial process.   Electronically Signed   By: Jeannine Boga M.D.   On: 01/12/2015 21:39   Mr Brain Wo Contrast  01/13/2015   CLINICAL DATA:  Seizure like activity.  Confusion.  EXAM: MRI HEAD WITHOUT CONTRAST  TECHNIQUE: Multiplanar, multiecho pulse sequences of the brain and surrounding structures were obtained without intravenous contrast.  COMPARISON:  CT head 01/12/2015 new  FINDINGS: Ventricle size is normal. Cerebral volume normal for age. Craniocervical junction normal.  Pituitary enlarged measuring 14 mm in height. On coronal T2 imaging, there is mild mass-effect on the left chiasm and optic nerve without compression. There is cystic change within the lesion which is most consistent with pituitary macro adenoma.  Negative for acute infarct.  Chronic infarct left frontal lobe just above the orbit corresponds to the CT hypodensity. Small chronic infarct in the high right frontal cortex. Remainder of the white matter intact. Brainstem is normal. Small chronic infarct left cerebellum.  Negative for hemorrhage.  No shift of the midline structures.  Mucosal edema paranasal sinuses.  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 There is mild mass-effect on the left optic nerve and chiasm. Followup MRI of the brain without and with contrast using pituitary protocol recommended for further evaluation.   Electronically Signed   By: Franchot Gallo M.D.   On: 01/13/2015 10:27    MEDICATIONS  Scheduled: . enoxaparin (LOVENOX) injection  40 mg Subcutaneous QHS  . insulin aspart  0-5 Units Subcutaneous QHS  . insulin aspart  0-9 Units Subcutaneous TID WC  . insulin glargine  40 Units Subcutaneous QHS  . 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 EEG pending, but given the MRI findings will maintain on keppra regardless of EEG results. Patient was also informed about incidental pituitary adenoma on MRI brain that must be follow up in the outpatient neurology/neurosurgery setting with MRI brain tailored to the pituitary gland. Patient was advised no to drive and observe other seizure precautions like no swimming alone, no climbing, or risky tasks.  Dorian Pod, MD [facility] [phone]  01/13/2015, 11:18 AM

## 2015-01-13 NOTE — Progress Notes (Signed)
Offsite EEG completed at [facility]. Results pending. 

## 2015-01-13 NOTE — Progress Notes (Signed)
UR complete 
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
## 2015-01-13 NOTE — Procedures (Signed)
EEG report.  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 Technique: this is a 17 channel routine scalp EEG performed at the bedside with bipolar and monopolar montages arranged in accordance to the international 10/20 system of electrode placement. One channel was dedicated to EKG recording.  The study was performed during wakefulness, drowsiness, and stage 2 sleep. No activating procedures performed.  Description:In the wakeful state, the best background consisted of a medium amplitude, posterior dominant, well sustained, symmetric and reactive 9 Hz rhythm. Drowsiness demonstrated dropout of the alpha rhythm. Stage 2 sleep showed symmetric and synchronous sleep spindles without intermixed epileptiform discharges. No focal or generalized epileptiform discharges noted.  No pathologic areas of slowing seen.  EKG showed sinus rhythm.  Impression: this is a normal awake and asleep EEG. Please, be aware that a normal EEG does not exclude the possibility of epilepsy.  Clinical correlation is advised.   Dorian Pod, MD [facility]

## 2015-01-13 NOTE — Progress Notes (Signed)
Inpatient Diabetes Program Recommendations  AACE/ADA: New Consensus Statement on Inpatient Glycemic Control (2013)  Target Ranges:  Prepandial:   less than 140 mg/dL      Peak postprandial:   less than 180 mg/dL (1-2 hours)      Critically ill patients:  140 - 180 mg/dL   Reason for Visit: Diabetes Consult  Diabetes history: Dm2 Outpatient Diabetes medications: Lantus 40 units bid, Janumet 50/500 bid Current orders for Inpatient glycemic control: Lantus 40 units QHS, Novolog sensitive tidwc  44 year old male patient with history of type II DM/IDDM, essential hypertension, gout, noncompliant with medications for at least a month due to insurance issues, admitted to the [facility] on 01/13/15 with multiple episodes of blank staring episodes and head tremors and sending for seizures  Results for ALASTER, ASFAW (MRN [id number]) as of 01/13/2015 18:00  Ref. Range 01/12/2015 20:12 01/12/2015 23:12 01/13/2015 07:14 01/13/2015 11:17 01/13/2015 17:09  Glucose-Capillary Latest Ref Range: 70-99 mg/dL 489 (H) 385 (H) 190 (H) 250 (H) 171 (H)  Results for TREGAN, READ (MRN [id number]) as of 01/13/2015 18:00  Ref. Range 01/13/2015 05:54  Sodium Latest Ref Range: 135-145 mmol/L 135  Potassium Latest Ref Range: 3.5-5.1 mmol/L 3.6  Chloride Latest Ref Range: 96-112 mmol/L 107  CO2 Latest Ref Range: 19-32 mmol/L 24  BUN Latest Ref Range: 6-23 mg/dL 20  Creatinine Latest Ref Range: 0.50-1.35 mg/dL 1.49 (H)  Calcium Latest Ref Range: 8.4-10.5 mg/dL 8.5  EGFR (Non-African Amer.) Latest Ref Range: >90 mL/min 56 (L)  EGFR (African American) Latest Ref Range: >90 mL/min 65 (L)  Glucose Latest Ref Range: 70-99 mg/dL 193 (H)  Anion gap Latest Ref Range: 5-15  4 (L)   Pt states he hasn't taken his diabetes medicines x 2 months d/t no insurance coverage. States he now has coverage.  Inpatient Diabetes Program Recommendations Insulin - Basal: Titrate if FBS >180 mg/dL. Pt states he was taking Lantus 40  units bid at home. Insulin - Meal Coverage: Consider addition of Novolog 6 units tidwc for meal coverage insulin if pt eats >50% meal HgbA1C: Need HgbA1C to assess glycemic control prior to hospitalization  Note: Will continue to follow. Will need to f/u with PCP for management of DM within 1 week of discharge.  Thank you. Lorenda Peck, RD, LDN, CDE Inpatient Diabetes Coordinator [phone]

## 2015-01-13 NOTE — Progress Notes (Signed)
PROGRESS NOTE    [name] EZM:[id number] DOB: July 20, 1971 DOA: 01/12/2015 PCP: Philis Fendt, MD  HPI/Brief narrative 44 year old male patient with history of type II DM/IDDM, essential hypertension, gout, noncompliant with medications for at least a month due to insurance issues, admitted to the [facility] on 01/13/15 with multiple episodes of blank staring episodes and head tremors and sending for seizures. One such episode was witnessed in the ED. Neurology consulted. 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   Assessment/Plan:  Principal Problem:   Seizure-new onset complex partial seizures - Neurology consultation and follow-up appreciated - CT & MRI head results as below. - Seizures likely symptomatic from old frontal infarcts - EEG pending - Loaded with IV Keppra and placed on maintenance Keppra 500 MG twice a day-continue regardless of EEG results - Patient and spouse at bedside were extensively counseled regarding following restrictions for patient: He is unable to drive, operate heavy machinery, perform activities at heights or participate in water activities until released by outpatient physician. They verbalized understanding.   Active Problems:   DM type 2, uncontrolled, with peripheral neuropathy - Patient states that he was on Lantus 40 units twice a day and Janumet 2 tabs twice a day which she has not taken for greater than one month. - Currently placed on Lantus 40 units at bedtime and NovoLog SSI. - Monitor CBGs and these will need further titrating.    Acute on chronic renal failure stage 2 - Baseline creatinine: Probably 1.4 range - Acute renal failure precipitated by dehydration from hyperglycemia - Resolved after IV fluids - Follow BMP    Essential hypertension - Reasonable inpatient control    Noncompliance - Counseled patient and spouse extensively regarding importance of appliance with M.D. follow-ups,  medications and they verbalized understanding.    Hyponatremia - Due to combination of dehydration and hyperglycemia/pseudohyponatremia - Resolved.    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 No visual symptoms reported - We will check prolactin level, TSH, free T4/free T4, GH, LH, FSH and testosterone levels - Neurology will order pituitary protocol MRI brain with and without contrast - We will need outpatient endocrinology and neurosurgery consultation   Code Status: Full Family Communication: Discussed extensively with spouse at bedside on 4/25 Disposition Plan: DC home in 1-2 days   Consultants:  Neurology  Procedures:  None  Antibiotics:  None   Subjective: Overall feels better. No further seizure-like episodes. Complaints of chronic pains and needles bilateral leg discomfort. Denies headache or visual symptoms.  Objective: Filed Vitals:   01/12/15 2008 01/12/15 2329 01/13/15 0006 01/13/15 0600  BP: 134/98 127/85  105/69  Pulse: 89 73  91  Temp:  98 F (36.7 C)  98.2 F (36.8 C)  TempSrc:  Oral  Oral  Resp: 15 17  16   Height:   6' (1.829 m)   Weight:   90.7 kg (199 lb 15.3 oz)   SpO2: 98% 100%  100%   No intake or output data in the 24 hours ending 01/13/15 1347 Filed Weights   01/13/15 0006  Weight: 90.7 kg (199 lb 15.3 oz)     Exam:  General exam: Pleasant young male lying comfortably in bed Respiratory system: Clear. No increased work of breathing. Cardiovascular system: S1 & S2 heard, RRR. No JVD, murmurs, gallops, clicks or pedal edema. Telemetry: Sinus rhythm Gastrointestinal system: Abdomen is nondistended, soft and nontender. Normal bowel sounds heard. Central  nervous system: Alert and oriented. No focal neurological deficits. Extremities: Symmetric 5 x 5 power.   Data Reviewed: Basic Metabolic Panel:  Recent Labs Lab 01/12/15 2040 01/12/15 2042  01/13/15 0554  NA 127*  --  135  K 4.6  --  3.6  CL 96  --  107  CO2 21  --  24  GLUCOSE 496*  --  193*  BUN 23  --  20  CREATININE 1.68*  --  1.49*  CALCIUM 9.2  --  8.5  MG  --  2.1  --   PHOS  --  3.9  --    Liver Function Tests:  Recent Labs Lab 01/12/15 2040  AST 22  ALT 30  ALKPHOS 73  BILITOT 0.3  PROT 7.6  ALBUMIN 3.7   No results for input(s): LIPASE, AMYLASE in the last 168 hours. No results for input(s): AMMONIA in the last 168 hours. CBC:  Recent Labs Lab 01/12/15 2040  WBC 6.1  NEUTROABS 3.1  HGB 13.8  HCT 39.7  MCV 77.2*  PLT 252   Cardiac Enzymes: No results for input(s): CKTOTAL, CKMB, CKMBINDEX, TROPONINI in the last 168 hours. BNP (last 3 results) No results for input(s): PROBNP in the last 8760 hours. CBG:  Recent Labs Lab 01/12/15 2012 01/12/15 2312 01/13/15 0714 01/13/15 1117  GLUCAP 489* 385* 190* 250*    No results found for this or any previous visit (from the past 240 hour(s)).       Studies: Ct Head Wo Contrast  01/12/2015   CLINICAL DATA:  Initial evaluation for altered mental status, confusion, headache, seizure activity.  EXAM: CT HEAD WITHOUT CONTRAST  TECHNIQUE: Contiguous axial images were obtained from the base of the skull through the vertex without intravenous contrast.  COMPARISON:  None available.  FINDINGS: Cerebral volume within normal limits for age. No significant white matter changes present.  There is asymmetric wedge-shaped hypodensity involving the peripheral aspect of the anterior left frontal lobe (series 2, image 12). While this finding may reflect volume averaging within adjacent cortical sulcus, possible acute ischemia is not entirely excluded.  No mass lesion or midline shift. No hydrocephalus. No extra-axial fluid collection.  Scalp soft tissues within normal limits. No acute abnormality about the orbits.  Paranasal sinuses and mastoid air cells are clear.  Calvarium intact.  IMPRESSION: 1. Asymmetric  hypodensity involving the cortical gray matter in the anterior left frontal lobe as above. This finding is indeterminate, and may reflect volume averaging with an adjacent cortical sulcus, although possible acute ischemia is not entirely excluded. Further evaluation with brain MRI is recommended if there is clinical concern for possible ischemia in this distribution. 2. No other acute intracranial process.   Electronically Signed   By: Jeannine Boga M.D.   On: 01/12/2015 21:39   Mr Brain Wo Contrast  01/13/2015   CLINICAL DATA:  Seizure like activity.  Confusion.  EXAM: MRI HEAD WITHOUT CONTRAST  TECHNIQUE: Multiplanar, multiecho pulse sequences of the brain and surrounding structures were obtained without intravenous contrast.  COMPARISON:  CT head 01/12/2015 new  FINDINGS: Ventricle size is normal. Cerebral volume normal for age. Craniocervical junction normal.  Pituitary enlarged measuring 14 mm in height. On coronal T2 imaging, there is mild mass-effect on the left chiasm and optic nerve without compression. There is cystic change within the lesion which is most consistent with pituitary macro adenoma.  Negative for acute infarct.  Chronic infarct left frontal lobe just above the orbit corresponds to  the CT hypodensity. Small chronic infarct in the high right frontal cortex. Remainder of the white matter intact. Brainstem is normal. Small chronic infarct left cerebellum.  Negative for hemorrhage.  No shift of the midline structures.  Mucosal edema paranasal sinuses.  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 There is mild mass-effect on the left optic nerve and chiasm. Followup MRI of the brain without and with contrast using pituitary protocol recommended for further evaluation.   Electronically Signed   By: Franchot Gallo M.D.   On: 01/13/2015 10:27        Scheduled  Meds: . enoxaparin (LOVENOX) injection  40 mg Subcutaneous QHS  . insulin aspart  0-5 Units Subcutaneous QHS  . insulin aspart  0-9 Units Subcutaneous TID WC  . insulin glargine  40 Units Subcutaneous QHS  . [START ON 01/14/2015] levETIRAcetam  500 mg Oral BID   Continuous Infusions: . sodium chloride 125 mL/hr at 01/12/15 2044    Principal Problem:   Seizure Active Problems:   DM type 2, uncontrolled, with neuropathy   Acute on chronic renal failure   Essential hypertension   Noncompliance   Complicated migraine   Hyponatremia    Time spent: 50 minutes    HONGALGI,ANAND, MD, FACP, FHM. [facility] Pager [phone]  If 7PM-7AM, please contact night-coverage [email or web address] Password [email or web address] 01/13/2015, 1:47 PM    LOS: 1 day

## 2015-01-14 ENCOUNTER — Inpatient Hospital Stay (HOSPITAL_COMMUNITY): Payer: 59

## 2015-01-14 DIAGNOSIS — E1165 Type 2 diabetes mellitus with hyperglycemia: Secondary | ICD-10-CM

## 2015-01-14 DIAGNOSIS — N179 Acute kidney failure, unspecified: Secondary | ICD-10-CM

## 2015-01-14 DIAGNOSIS — E1140 Type 2 diabetes mellitus with diabetic neuropathy, unspecified: Secondary | ICD-10-CM

## 2015-01-14 DIAGNOSIS — N189 Chronic kidney disease, unspecified: Secondary | ICD-10-CM

## 2015-01-14 DIAGNOSIS — D352 Benign neoplasm of pituitary gland: Secondary | ICD-10-CM

## 2015-01-14 LAB — GLUCOSE, CAPILLARY
GLUCOSE-CAPILLARY: 86 mg/dL (ref 70–99)
GLUCOSE-CAPILLARY: 253 mg/dL — AB (ref 70–99)
Glucose-Capillary: 165 mg/dL — ABNORMAL HIGH (ref 70–99)
Glucose-Capillary: 215 mg/dL — ABNORMAL HIGH (ref 70–99)

## 2015-01-14 LAB — T4, FREE: Free T4: 1.11 ng/dL (ref 0.80–1.80)

## 2015-01-14 LAB — BASIC METABOLIC PANEL
Anion gap: 7 (ref 5–15)
BUN: 17 mg/dL (ref 6–23)
CALCIUM: 9 mg/dL (ref 8.4–10.5)
CO2: 24 mmol/L (ref 19–32)
Chloride: 108 mmol/L (ref 96–112)
Creatinine, Ser: 1.45 mg/dL — ABNORMAL HIGH (ref 0.50–1.35)
GFR calc Af Amer: 67 mL/min — ABNORMAL LOW (ref >90)
GFR calc non Af Amer: 58 mL/min — ABNORMAL LOW (ref >90)
Glucose, Bld: 89 mg/dL (ref 70–99)
Potassium: 4.1 mmol/L (ref 3.5–5.1)
Sodium: 139 mmol/L (ref 135–145)

## 2015-01-14 LAB — FOLLICLE STIMULATING HORMONE: FSH: 7.8 m[IU]/mL (ref 1.5–12.4)

## 2015-01-14 LAB — T3, FREE: T3, Free: 1.6 pg/mL — ABNORMAL LOW (ref 2.0–4.4)

## 2015-01-14 LAB — HEMOGLOBIN A1C
Hgb A1c MFr Bld: >20 % — ABNORMAL HIGH (ref 4.8–5.6)
Mean Plasma Glucose: >527 mg/dL

## 2015-01-14 LAB — CORTISOL: Cortisol, Plasma: 10.5 ug/dL

## 2015-01-14 LAB — LUTEINIZING HORMONE: LH: 3.3 m[IU]/mL (ref 1.7–8.6)

## 2015-01-14 MED ORDER — GADOBENATE DIMEGLUMINE 529 MG/ML IV SOLN
15.0000 mL | Freq: Once | INTRAVENOUS | Status: AC | PRN
  Administered 2015-01-14: 13 mL via INTRAVENOUS

## 2015-01-14 MED ORDER — INSULIN GLARGINE 100 UNIT/ML ~~LOC~~ SOLN
37.0000 [IU] | Freq: Every day | SUBCUTANEOUS | Status: DC
  Administered 2015-01-14: 37 [IU] via SUBCUTANEOUS
  Filled 2015-01-14 (×2): qty 0.37

## 2015-01-14 NOTE — Progress Notes (Signed)
NEURO HOSPITALIST PROGRESS NOTE   SUBJECTIVE:                                                                                                                        [name] is siting in bed eating dinner. Wife is at the bedside. He has no neurological complains. No further seizures reported. Denies having side effects on keppra. EEG normal. MRI brain with and without contrast was personally reviewed and confirmedheterogeneously enhancing mostly round intra sellar mass with suprasellar extension encompasses 16 x 18 x 16 mm with effacing of the cavernous sinus more so the right. There is some effacement of the optic chiasm and posterior left optic nerve. Further, there is abnormal gyriform enhancement in the anterior superior right frontal lobe. TSH 0.788, T3 1.6, T4 1.11, LH 3.3, FSH 7.8, cortisol 10.5. Prolactin, GH pending.  OBJECTIVE:                                                                                                                           Vital signs in last 24 hours: Temp:  [97.6 F (36.4 C)-98.1 F (36.7 C)] 98.1 F (36.7 C) (04/26 1400) Pulse Rate:  [72-85] 72 (04/26 1400) Resp:  [18-20] 20 (04/26 1400) BP: (106-120)/(73-81) 112/76 mmHg (04/26 1400) SpO2:  [99 %-100 %] 99 % (04/26 1400)  Intake/Output from previous day: 04/25 0701 - 04/26 0700 In: -  Out: 200 [Urine:200] Intake/Output this shift: Total I/O In: 480 [P.O.:480] Out: 800 [Urine:800] Nutritional status: Diet Carb Modified Fluid consistency:: Thin; Room service appropriate?: Yes  Past Medical History  Diagnosis Date  . Hypertension   . Type II diabetes mellitus   . Gout     Physical exam: pleasant male in no apparent distress. Head: normocephalic. Neck: supple, no bruits, no JVD. Cardiac: no murmurs. Lungs: clear. Abdomen: soft, no tender, no mass. Extremities: no edema. Skin: no rash  Neurologic Exam:  Mental Status: Alert, oriented,  thought content appropriate. Speech fluent without evidence of aphasia. Able to follow 3 step commands without difficulty. Cranial Nerves: II: Discs flat bilaterally; Visual fields grossly normal, pupils equal, round, reactive to light and accommodation III,IV, VI: ptosis not present, extra-ocular motions intact bilaterally V,VII: smile symmetric, facial light touch  sensation normal bilaterally VIII: hearing normal bilaterally IX,X: gag reflex present XI: bilateral shoulder shrug XII: midline tongue extension Motor: Right :Upper extremity 5/5Left: Upper extremity 5/5 Lower extremity 5/5Lower extremity 5/5 Tone and bulk:normal tone throughout; no atrophy noted Sensory: Pinprick and light touch intact throughout, bilaterally Deep Tendon Reflexes: 2+ in the upper extremities and absent in the lower extremities Plantars: Right: downgoingLeft: downgoing Cerebellar: normal finger-to-nose and normal heel-to-shin testing bilaterally Gait: not tested secondary to seizure precautions  Lab Results: Lab Results  Component Value Date/Time   CHOL 238* 01/13/2015 05:54 AM   Lipid Panel  Recent Labs  01/13/15 0554  CHOL 238*  TRIG 566*  HDL 31*  CHOLHDL 7.7  VLDL UNABLE TO CALCULATE IF TRIGLYCERIDE OVER 400 mg/dL  LDLCALC UNABLE TO CALCULATE IF TRIGLYCERIDE OVER 400 mg/dL    Studies/Results: Ct Head Wo Contrast  01/12/2015   CLINICAL DATA:  Initial evaluation for altered mental status, confusion, headache, seizure activity.  EXAM: CT HEAD WITHOUT CONTRAST  TECHNIQUE: Contiguous axial images were obtained from the base of the skull through the vertex without intravenous contrast.  COMPARISON:  None available.  FINDINGS: Cerebral volume within normal limits for age. No significant white matter changes present.  There is asymmetric wedge-shaped  hypodensity involving the peripheral aspect of the anterior left frontal lobe (series 2, image 12). While this finding may reflect volume averaging within adjacent cortical sulcus, possible acute ischemia is not entirely excluded.  No mass lesion or midline shift. No hydrocephalus. No extra-axial fluid collection.  Scalp soft tissues within normal limits. No acute abnormality about the orbits.  Paranasal sinuses and mastoid air cells are clear.  Calvarium intact.  IMPRESSION: 1. Asymmetric hypodensity involving the cortical gray matter in the anterior left frontal lobe as above. This finding is indeterminate, and may reflect volume averaging with an adjacent cortical sulcus, although possible acute ischemia is not entirely excluded. Further evaluation with brain MRI is recommended if there is clinical concern for possible ischemia in this distribution. 2. No other acute intracranial process.   Electronically Signed   By: Jeannine Boga M.D.   On: 01/12/2015 21:39   Mr Brain Wo Contrast  01/13/2015   CLINICAL DATA:  Seizure like activity.  Confusion.  EXAM: MRI HEAD WITHOUT CONTRAST  TECHNIQUE: Multiplanar, multiecho pulse sequences of the brain and surrounding structures were obtained without intravenous contrast.  COMPARISON:  CT head 01/12/2015 new  FINDINGS: Ventricle size is normal. Cerebral volume normal for age. Craniocervical junction normal.  Pituitary enlarged measuring 14 mm in height. On coronal T2 imaging, there is mild mass-effect on the left chiasm and optic nerve without compression. There is cystic change within the lesion which is most consistent with pituitary macro adenoma.  Negative for acute infarct.  Chronic infarct left frontal lobe just above the orbit corresponds to the CT hypodensity. Small chronic infarct in the high right frontal cortex. Remainder of the white matter intact. Brainstem is normal. Small chronic infarct left cerebellum.  Negative for hemorrhage.  No shift of the  midline structures.  Mucosal edema paranasal sinuses.  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 There is mild mass-effect on the left optic nerve and chiasm. Followup MRI of the brain without and with contrast using pituitary protocol recommended for further evaluation.   Electronically Signed   By: Franchot Gallo M.D.   On: 01/13/2015 10:27   Mr Brain  W Wo Contrast  01/14/2015   CLINICAL DATA:  44 year old male with recent confusion and seizure like activity. Pituitary mass. Initial encounter.  EXAM: MRI HEAD WITHOUT AND WITH CONTRAST  TECHNIQUE: Multiplanar, multiecho pulse sequences of the brain and surrounding structures were obtained without and with intravenous contrast.  CONTRAST:  23mL MULTIHANCE GADOBENATE DIMEGLUMINE 529 MG/ML IV SOLN  COMPARISON:  Brain MRI 01/13/2015.  FINDINGS: Pre and post-contrast T1 weighted imaging of the brain including thin slice dedicated pituitary imaging.  There is gyriform enhancement in a 12-14 mm area of the anterior right superior frontal gyrus which was T2 and FLAIR hyperintense yesterday (series 12, image 21 of that study) and was facilitated on diffusion (series 500, image 23 of that exam). No associated mass effect.  No other abnormal gray or white matter enhancement identified.  Heterogeneously enhancing mostly round intra sellar mass with suprasellar extension encompasses 16 x 18 x 16 mm (AP by transverse by CC). Effaced cavernous sinus more so the right (series 6, image 45). Effaced optic chiasm and posterior left optic nerve better demonstrated yesterday. Deviated infundibulum. Perhaps mild hyper enhancement of the hypothalamus (series 11, image 6).  Normal bone marrow signal including along the floor of the sella turcica.  IMPRESSION: 1. Abnormal gyriform enhancement in the anterior superior right frontal lobe corresponding to abnormal T2  and FLAIR hyperintensity yesterday with facilitated diffusion and no mass effect. Query postictal sequelae and correlation with EEG recommended. 2. 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   Electronically Signed   By: Genevie Ann M.D.   On: 01/14/2015 15:35    MEDICATIONS                                                                                                                        Scheduled: . enoxaparin (LOVENOX) injection  40 mg Subcutaneous QHS  . insulin aspart  0-5 Units Subcutaneous QHS  . insulin aspart  0-9 Units Subcutaneous TID WC  . insulin glargine  37 Units Subcutaneous QHS  . 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 EEG negative. 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 There is some effacement of the optic chiasm and posterior left optic nerve but no hemorrhage or encasing of vascular structures in the cavernous sinus. At this moment patient has not vision compromise, hormone derangements, no dramatic involvement of adjacent sellar structures, and hence this tumor can be further addressed by neurosurgery as outpatient. Regarding the observed gyriform enhancement in the anterior superior right frontal lobe, it could be seizure related and certainly will need subsequent MRI evaluation to ensure resolution of such finding. Continue keppra at current dose. Neurology will sign off.  Dorian Pod, MD [facility] [phone]  01/14/2015, 6:13 PM

## 2015-01-14 NOTE — Progress Notes (Addendum)
PROGRESS NOTE    Rony Ratz XBJ:[id number] DOB: 06-09-1971 DOA: 01/12/2015 PCP: Philis Fendt, MD  HPI/Brief narrative 44 year old male patient with history of type II DM/IDDM, essential hypertension, gout, noncompliant with medications for at least a month due to insurance issues, admitted to the [facility] on 01/13/15 with multiple episodes of blank staring episodes and head tremors and sending for seizures. One such episode was witnessed in the ED. Neurology consulted. 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   Assessment/Plan:  Principal Problem:   Seizure-new onset complex partial seizures - Neurology consultation and follow-up appreciated - CT & MRI head results as below. 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 They verbalized understanding. - Repeat MRI results as below. Discussed with Dr. Armida Sans, Neurology states that the right frontal lobe changes could be postictal and can be followed up by repeat MRI as outpatient - Will need outpatient neurology follow-up. Case management consulted to assist with same.   Active Problems:   DM type 2, uncontrolled, with peripheral neuropathy - Patient states that he was on Lantus 40 units twice a day and Janumet 2 tabs twice a day which she has not taken for greater than one month. - Currently placed on Lantus 40 units at bedtime and NovoLog SSI. - Hemoglobin A1c >20 - CBGs fluctuating between 86-215 - May start metformin at discharge.    Acute on chronic renal failure stage 2 - Baseline creatinine:  Probably 1.4 range - Acute renal failure precipitated by dehydration from hyperglycemia - Resolved after IV fluids - Stable    Essential hypertension - Reasonable inpatient control    Noncompliance - Counseled patient and spouse extensively regarding importance of appliance with M.D. follow-ups, medications and they verbalized understanding. - Case management consulted.    Hyponatremia - Due to combination of dehydration and hyperglycemia/pseudohyponatremia - Resolved.    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 No visual symptoms reported. Does complain of intermittent frontal headaches for the last 1-2 months. Today also confirms 1 year history of sexual dysfunction/erectile dysfunction. - Prolactin levels: Pending - TSH: 0.788, free T4: 1.11 & free T3: 1.6 - Random cortisol: 10.5 - LH: 3.3 & FSH: 7.8 - Testosterone level: Pending - Reviewed pituitary protocol MRI brain results with Dr. Marshell Garfinkel indication for surgical intervention at this time but will need outpatient neurosurgery consultation. Medication management based on hormone results. - Will need outpatient endocrinology and neurosurgery consultation   Code Status: Full Family Communication: Discussed extensively with spouse at bedside on 4/25. None at bedside today. Disposition Plan: DC home possibly 4/27 pending serum prolactin results.   Consultants:  Neurology  Procedures:  None  Antibiotics:  None   Subjective: Remittent headaches for last 1-2 months. Erectile dysfunction for approximately a year. No visual symptoms.  Objective: Filed Vitals:   01/13/15 1426 01/13/15 2147 01/14/15 0547 01/14/15 1400  BP: 139/71 106/73 120/81 112/76  Pulse: 79 85 80 72  Temp: 98.4 F (36.9 C) 98 F (36.7 C) 97.6 F (36.4 C) 98.1 F (36.7 C)  TempSrc: Oral Oral Oral Oral  Resp: 18 20  18 20  Height:      Weight:      SpO2: 100% 100%  99% 99%    Intake/Output Summary (Last 24 hours) at 01/14/15 1752 Last data filed at 01/14/15 1511  Gross per 24 hour  Intake    480 ml  Output    800 ml  Net   -320 ml   Filed Weights   01/13/15 0006  Weight: 90.7 kg (199 lb 15.3 oz)     Exam:  General exam: Pleasant young male lying comfortably in bed Respiratory system: Clear. No increased work of breathing. Cardiovascular system: S1 & S2 heard, RRR. No JVD, murmurs, gallops, clicks or pedal edema.  Gastrointestinal system: Abdomen is nondistended, soft and nontender. Normal bowel sounds heard. Central nervous system: Alert and oriented. No focal neurological deficits. Extremities: Symmetric 5 x 5 power.   Data Reviewed: Basic Metabolic Panel:  Recent Labs Lab 01/12/15 2040 01/12/15 2042 01/13/15 0554 01/14/15 0530  NA 127*  --  135 139  K 4.6  --  3.6 4.1  CL 96  --  107 108  CO2 21  --  24 24  GLUCOSE 496*  --  193* 89  BUN 23  --  20 17  CREATININE 1.68*  --  1.49* 1.45*  CALCIUM 9.2  --  8.5 9.0  MG  --  2.1  --   --   PHOS  --  3.9  --   --    Liver Function Tests:  Recent Labs Lab 01/12/15 2040  AST 22  ALT 30  ALKPHOS 73  BILITOT 0.3  PROT 7.6  ALBUMIN 3.7   No results for input(s): LIPASE, AMYLASE in the last 168 hours. No results for input(s): AMMONIA in the last 168 hours. CBC:  Recent Labs Lab 01/12/15 2040  WBC 6.1  NEUTROABS 3.1  HGB 13.8  HCT 39.7  MCV 77.2*  PLT 252   Cardiac Enzymes: No results for input(s): CKTOTAL, CKMB, CKMBINDEX, TROPONINI in the last 168 hours. BNP (last 3 results) No results for input(s): PROBNP in the last 8760 hours. CBG:  Recent Labs Lab 01/13/15 1709 01/13/15 2147 01/14/15 0727 01/14/15 1146 01/14/15 1619  GLUCAP 171* 204* 86 165* 215*    No results found for this or any previous visit (from the past 240 hour(s)).       Studies: Ct Head Wo Contrast  01/12/2015   CLINICAL DATA:  Initial evaluation for altered mental status,  confusion, headache, seizure activity.  EXAM: CT HEAD WITHOUT CONTRAST  TECHNIQUE: Contiguous axial images were obtained from the base of the skull through the vertex without intravenous contrast.  COMPARISON:  None available.  FINDINGS: Cerebral volume within normal limits for age. No significant white matter changes present.  There is asymmetric wedge-shaped hypodensity involving the peripheral aspect of the anterior left frontal lobe (series 2, image 12). While this finding may reflect volume averaging within adjacent cortical sulcus, possible acute ischemia is not entirely excluded.  No mass lesion or midline shift. No hydrocephalus. No extra-axial fluid collection.  Scalp soft tissues within normal limits. No acute abnormality about the orbits.  Paranasal sinuses and mastoid air cells are clear.  Calvarium intact.  IMPRESSION: 1. Asymmetric hypodensity involving the cortical gray matter in the anterior left frontal lobe as above. This finding is indeterminate, and may reflect volume averaging with an adjacent cortical sulcus, although possible acute ischemia is not entirely excluded. Further evaluation with brain MRI is recommended if there is  clinical concern for possible ischemia in this distribution. 2. No other acute intracranial process.   Electronically Signed   By: Jeannine Boga M.D.   On: 01/12/2015 21:39   Mr Brain Wo Contrast  01/13/2015   CLINICAL DATA:  Seizure like activity.  Confusion.  EXAM: MRI HEAD WITHOUT CONTRAST  TECHNIQUE: Multiplanar, multiecho pulse sequences of the brain and surrounding structures were obtained without intravenous contrast.  COMPARISON:  CT head 01/12/2015 new  FINDINGS: Ventricle size is normal. Cerebral volume normal for age. Craniocervical junction normal.  Pituitary enlarged measuring 14 mm in height. On coronal T2 imaging, there is mild mass-effect on the left chiasm and optic nerve without compression. There is cystic change within the lesion which is  most consistent with pituitary macro adenoma.  Negative for acute infarct.  Chronic infarct left frontal lobe just above the orbit corresponds to the CT hypodensity. Small chronic infarct in the high right frontal cortex. Remainder of the white matter intact. Brainstem is normal. Small chronic infarct left cerebellum.  Negative for hemorrhage.  No shift of the midline structures.  Mucosal edema paranasal sinuses.  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 There is mild mass-effect on the left optic nerve and chiasm. Followup MRI of the brain without and with contrast using pituitary protocol recommended for further evaluation.   Electronically Signed   By: Franchot Gallo M.D.   On: 01/13/2015 10:27   Mr Jeri Cos KZ Contrast  01/14/2015   CLINICAL DATA:  44 year old male with recent confusion and seizure like activity. Pituitary mass. Initial encounter.  EXAM: MRI HEAD WITHOUT AND WITH CONTRAST  TECHNIQUE: Multiplanar, multiecho pulse sequences of the brain and surrounding structures were obtained without and with intravenous contrast.  CONTRAST:  56mL MULTIHANCE GADOBENATE DIMEGLUMINE 529 MG/ML IV SOLN  COMPARISON:  Brain MRI 01/13/2015.  FINDINGS: Pre and post-contrast T1 weighted imaging of the brain including thin slice dedicated pituitary imaging.  There is gyriform enhancement in a 12-14 mm area of the anterior right superior frontal gyrus which was T2 and FLAIR hyperintense yesterday (series 12, image 21 of that study) and was facilitated on diffusion (series 500, image 23 of that exam). No associated mass effect.  No other abnormal gray or white matter enhancement identified.  Heterogeneously enhancing mostly round intra sellar mass with suprasellar extension encompasses 16 x 18 x 16 mm (AP by transverse by CC). Effaced cavernous sinus more so the right (series 6, image 45). Effaced  optic chiasm and posterior left optic nerve better demonstrated yesterday. Deviated infundibulum. Perhaps mild hyper enhancement of the hypothalamus (series 11, image 6).  Normal bone marrow signal including along the floor of the sella turcica.  IMPRESSION: 1. Abnormal gyriform enhancement in the anterior superior right frontal lobe corresponding to abnormal T2 and FLAIR hyperintensity yesterday with facilitated diffusion and no mass effect. Query postictal sequelae and correlation with EEG recommended. 2. 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   Electronically Signed   By: Genevie Ann M.D.   On: 01/14/2015 15:35        Scheduled Meds: . enoxaparin (LOVENOX) injection  40 mg Subcutaneous QHS  . insulin aspart  0-5 Units Subcutaneous QHS  . insulin aspart  0-9 Units Subcutaneous TID WC  . insulin glargine  40 Units Subcutaneous QHS  . levETIRAcetam  500 mg Oral BID   Continuous Infusions:    Principal Problem:  Seizure Active Problems:   DM type 2, uncontrolled, with neuropathy   Acute on chronic renal failure   Essential hypertension   Noncompliance   Complicated migraine   Hyponatremia    Time spent: 40 minutes    Neale Marzette, MD, FACP, FHM. [facility] Pager [phone]  If 7PM-7AM, please contact night-coverage [email or web address] Password [email or web address] 01/14/2015, 5:52 PM    LOS: 2 days

## 2015-01-15 DIAGNOSIS — D352 Benign neoplasm of pituitary gland: Secondary | ICD-10-CM | POA: Insufficient documentation

## 2015-01-15 DIAGNOSIS — E871 Hypo-osmolality and hyponatremia: Secondary | ICD-10-CM

## 2015-01-15 DIAGNOSIS — G43909 Migraine, unspecified, not intractable, without status migrainosus: Secondary | ICD-10-CM

## 2015-01-15 DIAGNOSIS — R569 Unspecified convulsions: Secondary | ICD-10-CM | POA: Insufficient documentation

## 2015-01-15 DIAGNOSIS — Z9119 Patient's noncompliance with other medical treatment and regimen: Secondary | ICD-10-CM

## 2015-01-15 LAB — GLUCOSE, CAPILLARY
GLUCOSE-CAPILLARY: 201 mg/dL — AB (ref 70–99)
GLUCOSE-CAPILLARY: 250 mg/dL — AB (ref 70–99)
Glucose-Capillary: 154 mg/dL — ABNORMAL HIGH (ref 70–99)

## 2015-01-15 LAB — GROWTH HORMONE: GROWTH HORMONE: 2.9 ng/mL (ref 0.0–10.0)

## 2015-01-15 LAB — PROLACTIN: Prolactin: 24.7 ng/mL — ABNORMAL HIGH (ref 4.0–15.2)

## 2015-01-15 MED ORDER — BROMOCRIPTINE MESYLATE 2.5 MG PO TABS
1.2500 mg | ORAL_TABLET | Freq: Two times a day (BID) | ORAL | Status: DC
  Administered 2015-01-15 – 2015-01-16 (×2): 1.25 mg via ORAL
  Filled 2015-01-15 (×5): qty 1

## 2015-01-15 MED ORDER — INSULIN ASPART 100 UNIT/ML ~~LOC~~ SOLN
5.0000 [IU] | Freq: Three times a day (TID) | SUBCUTANEOUS | Status: DC
Start: 2015-01-15 — End: 2015-01-16
  Administered 2015-01-15 – 2015-01-16 (×4): 5 [IU] via SUBCUTANEOUS

## 2015-01-15 MED ORDER — GABAPENTIN 100 MG PO CAPS
100.0000 mg | ORAL_CAPSULE | Freq: Every day | ORAL | Status: DC
  Administered 2015-01-15: 100 mg via ORAL
  Filled 2015-01-15 (×2): qty 1

## 2015-01-15 MED ORDER — INSULIN GLARGINE 100 UNIT/ML ~~LOC~~ SOLN
38.0000 [IU] | Freq: Every day | SUBCUTANEOUS | Status: DC
  Administered 2015-01-15: 38 [IU] via SUBCUTANEOUS
  Filled 2015-01-15 (×2): qty 0.38

## 2015-01-15 NOTE — Progress Notes (Signed)
CARE MANAGEMENT NOTE 01/15/2015  Patient:  [name],[name]   Account Number:  [id number]  Date Initiated:  01/15/2015  Documentation initiated by:  Edwyna Shell  Subjective/Objective Assessment:   44 yo male admitted with seizure episode from home     Action/Plan:   discharge planning   Anticipated DC Date:  01/15/2015   Anticipated DC Plan:  [place]  CM consult      Choice offered to / List presented to:             Status of service:  Completed, signed off Medicare Important Message given?   (If response is "NO", the following Medicare IM given date fields will be blank) Date Medicare IM given:   Medicare IM given by:   Date Additional Medicare IM given:   Additional Medicare IM given by:    Discharge Disposition:  HOME/SELF CARE  Per UR Regulation:  Reviewed for med. necessity/level of care/duration of stay  If discussed at [place] of Stay Meetings, dates discussed:    Comments:  01/15/15 Leanne Chang RN BSN CM [phone] CM consulted for affordability of medications. Patient stated that he had a period without insurance and that is when he had difficulty with affording medications. He stated that since he has had insurance he has not had a problem with affordability and will follow up with Dr. Jeanie Cooks. Benefit check for Keppra 30 days $40 and 30 days of generic is $10.00, lantus 30 day supply $40.00.

## 2015-01-15 NOTE — Progress Notes (Signed)
Prolactin level slightly elevated and patient previously reported sexual dysfunction. Agree with starting low dose bromocriptine ( 1.25 mg BID with further adjustment as outpatient) to minimize side effects. Needs outpatient follow up with neurosurgery and endo. Dorian Pod, MD

## 2015-01-15 NOTE — Progress Notes (Signed)
PROGRESS NOTE    [name] LPF:[id number] DOB: 1971/03/18 DOA: 01/12/2015 PCP: Philis Fendt, MD  HPI/Brief narrative 44 year old male patient with history of type II DM/IDDM, essential hypertension, gout, noncompliant with medications for at least a month due to insurance issues, admitted to the [facility] on 01/13/15 with multiple episodes of blank staring episodes and head tremors and sending for seizures. One such episode was witnessed in the ED. Neurology consulted. 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  Assessment/Plan:    Seizure-new onset complex partial seizures - Neurology consultation and follow-up appreciated - CT & MRI head results as below. 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 They verbalized understanding. - Repeat MRI results as below. Discussed with Dr. Armida Sans, Neurology states that the right frontal lobe changes could be postictal and can be followed up by repeat MRI as outpatient - No visible seizure so far continue monitoring. Start bromocriptine keep overnight for monitoring.    DM type 2, uncontrolled, with peripheral neuropathy - Patient states that he was on Lantus 40 units twice a day and Janumet 2 tabs twice a day which she has not taken for greater than one month. - Currently placed on Lantus 40 units at bedtime and NovoLog SSI. - Hemoglobin A1c >20 - CBGs fluctuating between 86-215 - May start metformin at discharge.    Acute on chronic renal failure stage 2 - Baseline creatinine: Probably 1.4 range - Acute renal  failure precipitated by dehydration from hyperglycemia - Resolved after IV fluids - Stable    Essential hypertension - Reasonable inpatient control    Noncompliance - Counseled patient and spouse extensively regarding importance of appliance with M.D. follow-ups, medications and they verbalized understanding. - Case management consulted.    Hyponatremia - Due to combination of dehydration and hyperglycemia/pseudohyponatremia - Resolved.    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 No visual symptoms reported. Does complain of intermittent frontal headaches for the last 1-2 months. Today also confirms 1 year history of sexual dysfunction/erectile dysfunction. - Prolactin levels: Pending - TSH: 0.788, free T4: 1.11 & free T3: 1.6 - Random cortisol: 10.5 - LH: 3.3 & FSH: 7.8 - Testosterone level: Pending - Reviewed pituitary protocol MRI brain results with Dr. Marshell Garfinkel indication for surgical intervention at this time but will need outpatient neurosurgery consultation. Medication management based on hormone results. - Will need outpatient endocrinology and neurosurgery consultation -Neurology recommended bromocriptine 1.25 mg twice a day, to be adjusted as outpatient.   Code Status: Full Family Communication: Discussed extensively with spouse at bedside on 4/25. None at bedside today. Disposition Plan: DC home possibly 4/27 pending serum prolactin results.   Consultants:  Neurology  Procedures:  None  Antibiotics:  None   Subjective: Remittent headaches for last 1-2 months. Erectile dysfunction for approximately a year. No visual symptoms.  Objective: Filed Vitals:   01/14/15 1400 01/14/15 2126 01/15/15 0534 01/15/15 1430  BP: 112/76 123/90 113/74 140/92  Pulse: 72 84 81 88  Temp: 98.1 F (36.7 C) 98.1 F (36.7 C) 98.4 F (36.9 C) 98.4 F (36.9 C)  TempSrc:  Oral Oral Oral Oral  Resp: 20  18 18 18   Height:      Weight:      SpO2: 99% 99% 100% 100%    Intake/Output Summary (Last 24 hours) at 01/15/15 1700 Last data filed at 01/15/15 4193  Gross per 24 hour  Intake    420 ml  Output    475 ml  Net    -55 ml   Filed Weights   01/13/15 0006  Weight: 90.7 kg (199 lb 15.3 oz)     Exam:  General exam: Pleasant young male lying comfortably in bed Respiratory system: Clear. No increased work of breathing. Cardiovascular system: S1 & S2 heard, RRR. No JVD, murmurs, gallops, clicks or pedal edema.  Gastrointestinal system: Abdomen is nondistended, soft and nontender. Normal bowel sounds heard. Central nervous system: Alert and oriented. No focal neurological deficits. Extremities: Symmetric 5 x 5 power.   Data Reviewed: Basic Metabolic Panel:  Recent Labs Lab 01/12/15 2040 01/12/15 2042 01/13/15 0554 27-Jan-2015 0530  NA 127*  --  135 139  K 4.6  --  3.6 4.1  CL 96  --  107 108  CO2 21  --  24 24  GLUCOSE 496*  --  193* 89  BUN 23  --  20 17  CREATININE 1.68*  --  1.49* 1.45*  CALCIUM 9.2  --  8.5 9.0  MG  --  2.1  --   --   PHOS  --  3.9  --   --    Liver Function Tests:  Recent Labs Lab 01/12/15 2040  AST 22  ALT 30  ALKPHOS 73  BILITOT 0.3  PROT 7.6  ALBUMIN 3.7   No results for input(s): LIPASE, AMYLASE in the last 168 hours. No results for input(s): AMMONIA in the last 168 hours. CBC:  Recent Labs Lab 01/12/15 2040  WBC 6.1  NEUTROABS 3.1  HGB 13.8  HCT 39.7  MCV 77.2*  PLT 252   Cardiac Enzymes: No results for input(s): CKTOTAL, CKMB, CKMBINDEX, TROPONINI in the last 168 hours. BNP (last 3 results) No results for input(s): PROBNP in the last 8760 hours. CBG:  Recent Labs Lab 2015-01-27 1619 27-Jan-2015 2126 01/15/15 0721 01/15/15 1229 01/15/15 1618  GLUCAP 215* 253* 154* 201* 250*    No results found for this or any previous visit (from the past 240 hour(s)).       Studies: Mr Kizzie Fantasia Contrast  01-27-2015    CLINICAL DATA:  44 year old male with recent confusion and seizure like activity. Pituitary mass. Initial encounter.  EXAM: MRI HEAD WITHOUT AND WITH CONTRAST  TECHNIQUE: Multiplanar, multiecho pulse sequences of the brain and surrounding structures were obtained without and with intravenous contrast.  CONTRAST:  16mL MULTIHANCE GADOBENATE DIMEGLUMINE 529 MG/ML IV SOLN  COMPARISON:  Brain MRI 01/13/2015.  FINDINGS: Pre and post-contrast T1 weighted imaging of the brain including thin slice dedicated pituitary imaging.  There is gyriform enhancement in a 12-14 mm area of the anterior right superior frontal gyrus which was T2 and FLAIR hyperintense yesterday (series 12, image 21 of that study) and was facilitated on diffusion (series 500, image 23 of that exam). No associated mass effect.  No other abnormal gray or white matter enhancement identified.  Heterogeneously enhancing mostly round intra sellar mass with suprasellar extension encompasses 16 x 18 x 16 mm (AP by transverse by CC). Effaced cavernous sinus more so the right (series 6, image 45). Effaced optic chiasm and posterior left  optic nerve better demonstrated yesterday. Deviated infundibulum. Perhaps mild hyper enhancement of the hypothalamus (series 11, image 6).  Normal bone marrow signal including along the floor of the sella turcica.  IMPRESSION: 1. Abnormal gyriform enhancement in the anterior superior right frontal lobe corresponding to abnormal T2 and FLAIR hyperintensity yesterday with facilitated diffusion and no mass effect. Query postictal sequelae and correlation with EEG recommended. 2. 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   Electronically Signed   By: Genevie Ann M.D.   On: 01/14/2015 15:35        Scheduled Meds: . enoxaparin (LOVENOX) injection  40 mg Subcutaneous QHS  . gabapentin  100 mg Oral QHS  . insulin aspart  0-9 Units Subcutaneous TID WC  . insulin aspart  5 Units Subcutaneous TID WC  . insulin glargine  38 Units  Subcutaneous QHS  . levETIRAcetam  500 mg Oral BID   Continuous Infusions:    Principal Problem:   Seizure Active Problems:   Essential hypertension   DM type 2, uncontrolled, with neuropathy   Noncompliance   Complicated migraine   Hyponatremia   Acute on chronic renal failure    Time spent: 45 minutes    Pharrah Rottman A, MD, FACP, FHM. [facility] Pager [phone]  If 7PM-7AM, please contact night-coverage [email or web address] Password [email or web address] 01/15/2015, 5:00 PM    LOS: 3 days

## 2015-01-16 DIAGNOSIS — I10 Essential (primary) hypertension: Secondary | ICD-10-CM

## 2015-01-16 LAB — GLUCOSE, CAPILLARY
GLUCOSE-CAPILLARY: 106 mg/dL — AB (ref 70–99)
GLUCOSE-CAPILLARY: 94 mg/dL (ref 70–99)
Glucose-Capillary: 133 mg/dL — ABNORMAL HIGH (ref 70–99)
Glucose-Capillary: 178 mg/dL — ABNORMAL HIGH (ref 70–99)

## 2015-01-16 MED ORDER — BROMOCRIPTINE MESYLATE 2.5 MG PO TABS: 1.2500 mg | ORAL_TABLET | Freq: Two times a day (BID) | ORAL | Status: AC

## 2015-01-16 MED ORDER — INSULIN GLARGINE 100 UNITS/ML SOLOSTAR PEN: 35.0000 [IU] | PEN_INJECTOR | Freq: Every day | SUBCUTANEOUS | Status: AC

## 2015-01-16 MED ORDER — GABAPENTIN 100 MG PO CAPS: 100.0000 mg | ORAL_CAPSULE | Freq: Every day | ORAL | Status: AC

## 2015-01-16 MED ORDER — LEVETIRACETAM 500 MG PO TABS: 500.0000 mg | ORAL_TABLET | Freq: Two times a day (BID) | ORAL | Status: AC

## 2015-01-16 MED ORDER — INSULIN ASPART 100 UNIT/ML ~~LOC~~ SOLN: 7.0000 [IU] | Freq: Three times a day (TID) | SUBCUTANEOUS | Status: AC

## 2015-01-16 NOTE — Discharge Summary (Signed)
Physician Discharge Summary  [name] ZOX:[id number] DOB: July 12, 1971 DOA: 01/12/2015  PCP: Philis Fendt, MD  Admit date: 01/12/2015 Discharge date: 01/16/2015  Time spent: 40 minutes  Recommendations for Outpatient Follow-up:  1. Follow-up with primary care physician in one week. 2. Follow-up with Dr. Dwyane Dee, endocrine in 1-2 weeks, for both pituitary adenoma and diabetes. 3. Follow-up with neurology as outpatient for new onset seizure, started on Keppra  Discharge Diagnoses:  Principal Problem:   Seizure Active Problems:   Essential hypertension   DM type 2, uncontrolled, with neuropathy   Noncompliance   Complicated migraine   Hyponatremia   Acute on chronic renal failure   Pituitary adenoma   Seizure-like activity   Discharge Condition: Stable  Diet recommendation: Carb modified  Filed Weights   01/13/15 0006  Weight: 90.7 kg (199 lb 15.3 oz)    History of present illness:  [name] is a 44 y.o. male with prior h/o migrianes and diabetes mellitus brought by his wife for multiple episodes of staring blankly to the left with associated tremors of the head. One episode was witnessed in ED. He reports associated headache and dizziness . He reports some nausea, no vomiting . He denies any other complaints. His initial CT head shows asymmetric hypodensity involving the cortical gray matter in the anteiror left frontal lobe. Neurology consulted by EDP and he was referred to medical service for admission.   Hospital Course:   Seizure-new onset complex partial seizures - Neurology consultation and follow-up appreciated - CT & MRI head results as below. 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 They verbalized understanding. - Repeat MRI results as below. Discussed with Dr. Armida Sans, Neurology states that the right frontal lobe changes could be postictal and can be followed up by repeat MRI as outpatient - Discharged on Keppra 500 mg twice a day.   DM type 2, uncontrolled, with peripheral neuropathy - Patient states that he was on Lantus 40 units twice a day and Janumet 2 tabs twice a day which she has not taken for greater than one month. - Hemoglobin A1c >20, correlate with mean plasma glucose of >527. - Placed on 35 units of Lantus and 7 units of NovoLog with meals, instructed to check blood sugar at least twice a day and take sugar log to appointment with endocrinologist.   Acute on chronic renal failure stage 2 - Baseline creatinine: Probably 1.4 range - Acute renal failure precipitated by dehydration from hyperglycemia - Creatinine 1.451 discharge, probably will benefit from ACEI or ARB to be started as outpatient.   Essential hypertension - Reasonable inpatient control   Noncompliance - Counseled patient and spouse extensively regarding importance of appliance with M.D. follow-ups, medications and they verbalized understanding.   Hyponatremia - Due to combination of dehydration and hyperglycemia/pseudohyponatremia - Resolved.   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 No visual symptoms reported. Does complain of intermittent frontal headaches for the last 1-2 months. Today also confirms 1 year history of sexual dysfunction/erectile dysfunction. - Prolactin levels: Slightly elevated at 24.7 (reference range: 4-15.2) - TSH: 0.788, free T4: 1.11 &  free T3: 1.6 - Random cortisol: 10.5 - LH: 3.3 & FSH: 7.8 - Reviewed pituitary protocol MRI brain results with  Dr. Marshell Garfinkel indication for surgical intervention at this time but will need outpatient neurosurgery consultation. Medication management based on hormone results. - Will need outpatient endocrinology and neurosurgery follow-up, I have asked him to follow-up with Dr. Eduard Clos -Neurology recommended bromocriptine 1.25 mg twice a day, to be adjusted as outpatient.  Procedures:  None  Consultations:  None  Discharge Exam: Filed Vitals:   01/16/15 1335  BP: 118/79  Pulse: 79  Temp: 98.1 F (36.7 C)  Resp: 18   General: Alert and awake, oriented x3, not in any acute distress. HEENT: anicteric sclera, pupils reactive to light and accommodation, EOMI CVS: S1-S2 clear, no murmur rubs or gallops Chest: clear to auscultation bilaterally, no wheezing, rales or rhonchi Abdomen: soft nontender, nondistended, normal bowel sounds, no organomegaly Extremities: no cyanosis, clubbing or edema noted bilaterally Neuro: Cranial nerves II-XII intact, no focal neurological deficits  Discharge Instructions   Discharge Instructions    Diet - low sodium heart healthy    Complete by:  As directed      Increase activity slowly    Complete by:  As directed           Current Discharge Medication List    START taking these medications   Details  bromocriptine (PARLODEL) 2.5 MG tablet Take 0.5 tablets (1.25 mg total) by mouth 2 (two) times daily. Qty: 60 tablet, Refills: 0    gabapentin (NEURONTIN) 100 MG capsule Take 1 capsule (100 mg total) by mouth at bedtime. Qty: 30 capsule, Refills: 0    insulin aspart (NOVOLOG) 100 UNIT/ML injection Inject 7 Units into the skin 3 (three) times daily with meals. Qty: 10 mL, Refills: 11    levETIRAcetam (KEPPRA) 500 MG tablet Take 1 tablet (500 mg total) by mouth 2 (two) times daily. Qty: 60 tablet, Refills: 0      CONTINUE these medications which have CHANGED   Details  insulin glargine (LANTUS) 100 unit/mL SOPN Inject 0.35 mLs (35 Units total) into  the skin at bedtime. Qty: 5 pen, Refills: 5      STOP taking these medications     sitaGLIPtin-metformin (JANUMET) 50-1000 MG per tablet      ibuprofen (ADVIL,MOTRIN) 800 MG tablet      oxyCODONE-acetaminophen (PERCOCET) 5-325 MG per tablet        No Known Allergies    The results of significant diagnostics from this hospitalization (including imaging, microbiology, ancillary and laboratory) are listed below for reference.    Significant Diagnostic Studies: Ct Head Wo Contrast  01/12/2015   CLINICAL DATA:  Initial evaluation for altered mental status, confusion, headache, seizure activity.  EXAM: CT HEAD WITHOUT CONTRAST  TECHNIQUE: Contiguous axial images were obtained from the base of the skull through the vertex without intravenous contrast.  COMPARISON:  None available.  FINDINGS: Cerebral volume within normal limits for age. No significant white matter changes present.  There is asymmetric wedge-shaped hypodensity involving the peripheral aspect of the anterior left frontal lobe (series 2, image 12). While this finding may reflect volume averaging within adjacent cortical sulcus, possible acute ischemia is not entirely excluded.  No mass lesion or midline shift. No hydrocephalus. No extra-axial fluid collection.  Scalp soft tissues within normal limits. No acute abnormality about the orbits.  Paranasal sinuses and mastoid air cells are clear.  Calvarium intact.  IMPRESSION: 1. Asymmetric hypodensity involving the cortical  gray matter in the anterior left frontal lobe as above. This finding is indeterminate, and may reflect volume averaging with an adjacent cortical sulcus, although possible acute ischemia is not entirely excluded. Further evaluation with brain MRI is recommended if there is clinical concern for possible ischemia in this distribution. 2. No other acute intracranial process.   Electronically Signed   By: Jeannine Boga M.D.   On: 01/12/2015 21:39   Mr Brain Wo  Contrast  01/13/2015   CLINICAL DATA:  Seizure like activity.  Confusion.  EXAM: MRI HEAD WITHOUT CONTRAST  TECHNIQUE: Multiplanar, multiecho pulse sequences of the brain and surrounding structures were obtained without intravenous contrast.  COMPARISON:  CT head 01/12/2015 new  FINDINGS: Ventricle size is normal. Cerebral volume normal for age. Craniocervical junction normal.  Pituitary enlarged measuring 14 mm in height. On coronal T2 imaging, there is mild mass-effect on the left chiasm and optic nerve without compression. There is cystic change within the lesion which is most consistent with pituitary macro adenoma.  Negative for acute infarct.  Chronic infarct left frontal lobe just above the orbit corresponds to the CT hypodensity. Small chronic infarct in the high right frontal cortex. Remainder of the white matter intact. Brainstem is normal. Small chronic infarct left cerebellum.  Negative for hemorrhage.  No shift of the midline structures.  Mucosal edema paranasal sinuses.  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 There is mild mass-effect on the left optic nerve and chiasm. Followup MRI of the brain without and with contrast using pituitary protocol recommended for further evaluation.   Electronically Signed   By: Franchot Gallo M.D.   On: 01/13/2015 10:27   Mr Jeri Cos AG Contrast  01/14/2015   CLINICAL DATA:  44 year old male with recent confusion and seizure like activity. Pituitary mass. Initial encounter.  EXAM: MRI HEAD WITHOUT AND WITH CONTRAST  TECHNIQUE: Multiplanar, multiecho pulse sequences of the brain and surrounding structures were obtained without and with intravenous contrast.  CONTRAST:  75mL MULTIHANCE GADOBENATE DIMEGLUMINE 529 MG/ML IV SOLN  COMPARISON:  Brain MRI 01/13/2015.  FINDINGS: Pre and post-contrast T1 weighted imaging of the brain including thin  slice dedicated pituitary imaging.  There is gyriform enhancement in a 12-14 mm area of the anterior right superior frontal gyrus which was T2 and FLAIR hyperintense yesterday (series 12, image 21 of that study) and was facilitated on diffusion (series 500, image 23 of that exam). No associated mass effect.  No other abnormal gray or white matter enhancement identified.  Heterogeneously enhancing mostly round intra sellar mass with suprasellar extension encompasses 16 x 18 x 16 mm (AP by transverse by CC). Effaced cavernous sinus more so the right (series 6, image 45). Effaced optic chiasm and posterior left optic nerve better demonstrated yesterday. Deviated infundibulum. Perhaps mild hyper enhancement of the hypothalamus (series 11, image 6).  Normal bone marrow signal including along the floor of the sella turcica.  IMPRESSION: 1. Abnormal gyriform enhancement in the anterior superior right frontal lobe corresponding to abnormal T2 and FLAIR hyperintensity yesterday with facilitated diffusion and no mass effect. Query postictal sequelae and correlation with EEG recommended. 2. 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   Electronically Signed   By: Genevie Ann M.D.   On: 01/14/2015 15:35    Microbiology: No results found for this or any previous visit (from the past 240 hour(s)).   Labs:  Basic Metabolic Panel:  Recent Labs Lab 01/12/15 2040 01/12/15 2042 01/13/15 0554 01/14/15 0530  NA 127*  --  135 139  K 4.6  --  3.6 4.1  CL 96  --  107 108  CO2 21  --  24 24  GLUCOSE 496*  --  193* 89  BUN 23  --  20 17  CREATININE 1.68*  --  1.49* 1.45*  CALCIUM 9.2  --  8.5 9.0  MG  --  2.1  --   --   PHOS  --  3.9  --   --    Liver Function Tests:  Recent Labs Lab 01/12/15 2040  AST 22  ALT 30  ALKPHOS 73  BILITOT 0.3  PROT 7.6  ALBUMIN 3.7   No results for input(s): LIPASE, AMYLASE in the last 168 hours. No results for input(s): AMMONIA in the last 168 hours. CBC:  Recent Labs Lab  01/12/15 2040  WBC 6.1  NEUTROABS 3.1  HGB 13.8  HCT 39.7  MCV 77.2*  PLT 252   Cardiac Enzymes: No results for input(s): CKTOTAL, CKMB, CKMBINDEX, TROPONINI in the last 168 hours. BNP: BNP (last 3 results) No results for input(s): BNP in the last 8760 hours.  ProBNP (last 3 results) No results for input(s): PROBNP in the last 8760 hours.  CBG:  Recent Labs Lab 01/15/15 1229 01/15/15 1618 01/15/15 2111 01/16/15 0735 01/16/15 1123  GLUCAP 201* 250* 133* 106* 94       Signed:  Taqwa Deem A  [facility] 01/16/2015, 2:01 PM

## 2015-02-04 ENCOUNTER — Ambulatory Visit: Payer: 59 | Admitting: Endocrinology

## 2015-02-04 ENCOUNTER — Telehealth: Payer: Self-pay | Admitting: Endocrinology

## 2015-02-04 ENCOUNTER — Encounter: Payer: Self-pay | Admitting: *Deleted

## 2015-02-04 DIAGNOSIS — Z0289 Encounter for other administrative examinations: Secondary | ICD-10-CM

## 2015-02-04 NOTE — Telephone Encounter (Signed)
Patient no showed today's appt. Please advise on how to follow up. °A. No follow up necessary. °B. Follow up urgent. Contact patient immediately. °C. Follow up necessary. Contact patient and schedule visit in ___ days. °D. Follow up advised. Contact patient and schedule visit in ____weeks. ° °

## 2015-02-04 NOTE — Telephone Encounter (Signed)
Letter mailed

## 2015-02-11 NOTE — Telephone Encounter (Signed)
Attempted to reach the pt via phone but phone is not working

## 2015-03-06 ENCOUNTER — Encounter (HOSPITAL_COMMUNITY): Payer: Self-pay

## 2015-03-06 ENCOUNTER — Emergency Department (HOSPITAL_COMMUNITY)
Admission: EM | Admit: 2015-03-06 | Discharge: 2015-03-07 | Disposition: A | Discharge status: Home / Self Care | Payer: 59 | Attending: Emergency Medicine | Admitting: Emergency Medicine

## 2015-03-06 DIAGNOSIS — E785 Hyperlipidemia, unspecified: Secondary | ICD-10-CM | POA: Insufficient documentation

## 2015-03-06 DIAGNOSIS — Z79899 Other long term (current) drug therapy: Secondary | ICD-10-CM | POA: Insufficient documentation

## 2015-03-06 DIAGNOSIS — R1012 Left upper quadrant pain: Secondary | ICD-10-CM | POA: Diagnosis present

## 2015-03-06 DIAGNOSIS — R079 Chest pain, unspecified: Secondary | ICD-10-CM | POA: Diagnosis not present

## 2015-03-06 DIAGNOSIS — Z8739 Personal history of other diseases of the musculoskeletal system and connective tissue: Secondary | ICD-10-CM | POA: Insufficient documentation

## 2015-03-06 DIAGNOSIS — E119 Type 2 diabetes mellitus without complications: Secondary | ICD-10-CM | POA: Diagnosis not present

## 2015-03-06 DIAGNOSIS — I10 Essential (primary) hypertension: Secondary | ICD-10-CM | POA: Diagnosis not present

## 2015-03-06 DIAGNOSIS — Z794 Long term (current) use of insulin: Secondary | ICD-10-CM | POA: Diagnosis not present

## 2015-03-06 LAB — CBC
HCT: 39.1 % (ref 39.0–52.0)
HEMOGLOBIN: 13 g/dL (ref 13.0–17.0)
MCH: 26.1 pg (ref 26.0–34.0)
MCHC: 33.2 g/dL (ref 30.0–36.0)
MCV: 78.4 fL (ref 78.0–100.0)
Platelets: 307 10*3/uL (ref 150–400)
RBC: 4.99 MIL/uL (ref 4.22–5.81)
RDW: 13.7 % (ref 11.5–15.5)
WBC: 4.6 10*3/uL (ref 4.0–10.5)

## 2015-03-06 LAB — URINALYSIS, ROUTINE W REFLEX MICROSCOPIC
Bilirubin Urine: NEGATIVE
Glucose, UA: 100 mg/dL — AB
HGB URINE DIPSTICK: NEGATIVE
Ketones, ur: NEGATIVE mg/dL
Leukocytes, UA: NEGATIVE
Nitrite: NEGATIVE
PROTEIN: NEGATIVE mg/dL
Specific Gravity, Urine: 1.017 (ref 1.005–1.030)
Urobilinogen, UA: 0.2 mg/dL (ref 0.0–1.0)
pH: 5.5 (ref 5.0–8.0)

## 2015-03-06 LAB — HEPATIC FUNCTION PANEL
ALBUMIN: 4 g/dL (ref 3.5–5.0)
ALT: 16 U/L — AB (ref 17–63)
AST: 15 U/L (ref 15–41)
Alkaline Phosphatase: 51 U/L (ref 38–126)
BILIRUBIN TOTAL: 0.6 mg/dL (ref 0.3–1.2)
Total Protein: 8.1 g/dL (ref 6.5–8.1)

## 2015-03-06 LAB — BASIC METABOLIC PANEL
Anion gap: 8 (ref 5–15)
BUN: 21 mg/dL — ABNORMAL HIGH (ref 6–20)
CO2: 28 mmol/L (ref 22–32)
Calcium: 9.2 mg/dL (ref 8.9–10.3)
Chloride: 99 mmol/L — ABNORMAL LOW (ref 101–111)
Creatinine, Ser: 1.47 mg/dL — ABNORMAL HIGH (ref 0.61–1.24)
GFR, EST NON AFRICAN AMERICAN: 57 mL/min — AB (ref >60)
Glucose, Bld: 193 mg/dL — ABNORMAL HIGH (ref 65–99)
Potassium: 3.8 mmol/L (ref 3.5–5.1)
SODIUM: 135 mmol/L (ref 135–145)

## 2015-03-06 LAB — I-STAT TROPONIN, ED: Troponin i, poc: 0 ng/mL (ref 0.00–0.08)

## 2015-03-06 LAB — LIPASE, BLOOD: Lipase: 32 U/L (ref 22–51)

## 2015-03-06 MED ORDER — MORPHINE SULFATE 4 MG/ML IJ SOLN
4.0000 mg | Freq: Once | INTRAMUSCULAR | Status: AC
  Administered 2015-03-07: 4 mg via INTRAVENOUS
  Filled 2015-03-06: qty 1

## 2015-03-06 MED ORDER — ONDANSETRON HCL 4 MG/2ML IJ SOLN
4.0000 mg | Freq: Once | INTRAMUSCULAR | Status: AC
  Administered 2015-03-06: 4 mg via INTRAVENOUS
  Filled 2015-03-06: qty 2

## 2015-03-06 MED ORDER — SODIUM CHLORIDE 0.9 % IV BOLUS (SEPSIS)
1000.0000 mL | Freq: Once | INTRAVENOUS | Status: AC
  Administered 2015-03-06: 1000 mL via INTRAVENOUS

## 2015-03-06 MED ORDER — GI COCKTAIL ~~LOC~~
30.0000 mL | Freq: Once | ORAL | Status: AC
  Administered 2015-03-06: 30 mL via ORAL
  Filled 2015-03-06: qty 30

## 2015-03-06 NOTE — ED Notes (Signed)
Pt presents with c/o chest pain and abdominal pain that started 2 days ago. Pt reports the pain is in the left of his chest, some radiation to his back. Pt also reports some abdominal pain with vomiting. No diarrhea.
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
## 2015-03-07 ENCOUNTER — Emergency Department (HOSPITAL_COMMUNITY): Payer: 59

## 2015-03-07 MED ORDER — PANTOPRAZOLE SODIUM 20 MG PO TBEC: 20.0000 mg | DELAYED_RELEASE_TABLET | Freq: Every day | ORAL | Status: DC

## 2015-03-07 MED ORDER — SUCRALFATE 1 GM/10ML PO SUSP: 1.0000 g | Freq: Three times a day (TID) | ORAL | Status: AC

## 2015-03-07 MED ORDER — HYDROCODONE-ACETAMINOPHEN 5-325 MG PO TABS: 1.0000 | ORAL_TABLET | Freq: Four times a day (QID) | ORAL | Status: AC | PRN

## 2015-03-07 MED ORDER — ONDANSETRON HCL 4 MG/2ML IJ SOLN
4.0000 mg | Freq: Once | INTRAMUSCULAR | Status: AC
  Administered 2015-03-07: 4 mg via INTRAVENOUS
  Filled 2015-03-07: qty 2

## 2015-03-07 MED ORDER — IOHEXOL 300 MG/ML  SOLN
50.0000 mL | Freq: Once | INTRAMUSCULAR | Status: AC | PRN
  Administered 2015-03-07: 50 mL via ORAL

## 2015-03-07 NOTE — Discharge Instructions (Signed)
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
## 2015-03-07 NOTE — ED Notes (Signed)
Pt ambulating independently w/ steady gait on d/c in no acute distress, A&Ox4. D/c instructions reviewed w/ pt and family - pt and family deny any further questions or concerns at present. Rx given x3  

## 2015-03-21 ENCOUNTER — Telehealth: Payer: Self-pay | Admitting: Endocrinology

## 2015-03-21 NOTE — Telephone Encounter (Signed)
Pt needs new rx for novolog and a PA will be needed. Per pharmacist a possible alternate of Humalog could be called in and may not need PA. Please advise. [facility] states that he has an outstanding rx from Dr. Verlee Monte however because they do not see him anymore they will not do the PA.

## 2015-03-25 NOTE — Telephone Encounter (Signed)
This patient has never been seen by Dr. Dwyane Dee, he no showed his appointment, we can not prescribe medication until we have seen the patient

## 2016-08-03 LAB — TESTOSTERONE: Testosterone: 94 ng/dL — ABNORMAL LOW (ref 348–1197)

## 2016-10-13 ENCOUNTER — Encounter (HOSPITAL_COMMUNITY): Payer: Self-pay | Admitting: Emergency Medicine

## 2016-10-13 DIAGNOSIS — N289 Disorder of kidney and ureter, unspecified: Secondary | ICD-10-CM | POA: Insufficient documentation

## 2016-10-13 DIAGNOSIS — R112 Nausea with vomiting, unspecified: Secondary | ICD-10-CM | POA: Diagnosis not present

## 2016-10-13 DIAGNOSIS — I10 Essential (primary) hypertension: Secondary | ICD-10-CM | POA: Insufficient documentation

## 2016-10-13 DIAGNOSIS — E119 Type 2 diabetes mellitus without complications: Secondary | ICD-10-CM | POA: Insufficient documentation

## 2016-10-13 DIAGNOSIS — E1165 Type 2 diabetes mellitus with hyperglycemia: Secondary | ICD-10-CM | POA: Diagnosis present

## 2016-10-13 DIAGNOSIS — Z794 Long term (current) use of insulin: Secondary | ICD-10-CM | POA: Diagnosis not present

## 2016-10-13 LAB — COMPREHENSIVE METABOLIC PANEL
ALT: 10 U/L — ABNORMAL LOW (ref 17–63)
AST: 12 U/L — ABNORMAL LOW (ref 15–41)
Albumin: 4.2 g/dL (ref 3.5–5.0)
Alkaline Phosphatase: 63 U/L (ref 38–126)
Anion gap: 7 (ref 5–15)
BILIRUBIN TOTAL: 0.6 mg/dL (ref 0.3–1.2)
BUN: 31 mg/dL — ABNORMAL HIGH (ref 6–20)
CO2: 24 mmol/L (ref 22–32)
Calcium: 9.2 mg/dL (ref 8.9–10.3)
Chloride: 102 mmol/L (ref 101–111)
Creatinine, Ser: 2.09 mg/dL — ABNORMAL HIGH (ref 0.61–1.24)
GFR calc Af Amer: 43 mL/min — ABNORMAL LOW (ref >60)
GFR, EST NON AFRICAN AMERICAN: 37 mL/min — AB (ref >60)
Glucose, Bld: 196 mg/dL — ABNORMAL HIGH (ref 65–99)
POTASSIUM: 4.8 mmol/L (ref 3.5–5.1)
Sodium: 133 mmol/L — ABNORMAL LOW (ref 135–145)
TOTAL PROTEIN: 8.4 g/dL — AB (ref 6.5–8.1)

## 2016-10-13 LAB — CBC
HCT: 35.2 % — ABNORMAL LOW (ref 39.0–52.0)
HEMOGLOBIN: 12.2 g/dL — AB (ref 13.0–17.0)
MCH: 26.7 pg (ref 26.0–34.0)
MCHC: 34.7 g/dL (ref 30.0–36.0)
MCV: 77 fL — ABNORMAL LOW (ref 78.0–100.0)
Platelets: 331 10*3/uL (ref 150–400)
RBC: 4.57 MIL/uL (ref 4.22–5.81)
RDW: 14.6 % (ref 11.5–15.5)
WBC: 5.4 10*3/uL (ref 4.0–10.5)

## 2016-10-13 LAB — URINALYSIS, ROUTINE W REFLEX MICROSCOPIC
Bacteria, UA: NONE SEEN
Bilirubin Urine: NEGATIVE
Glucose, UA: NEGATIVE mg/dL
Ketones, ur: NEGATIVE mg/dL
LEUKOCYTES UA: NEGATIVE
NITRITE: NEGATIVE
Protein, ur: NEGATIVE mg/dL
SPECIFIC GRAVITY, URINE: 1.013 (ref 1.005–1.030)
Squamous Epithelial / LPF: NONE SEEN
pH: 5 (ref 5.0–8.0)

## 2016-10-13 LAB — CBG MONITORING, ED: Glucose-Capillary: 182 mg/dL — ABNORMAL HIGH (ref 65–99)

## 2016-10-13 LAB — LIPASE, BLOOD: Lipase: 37 U/L (ref 11–51)

## 2016-10-13 MED ORDER — ONDANSETRON 4 MG PO TBDP
4.0000 mg | ORAL_TABLET | Freq: Once | ORAL | Status: AC | PRN
  Administered 2016-10-13: 4 mg via ORAL
  Filled 2016-10-13: qty 1

## 2016-10-13 NOTE — ED Triage Notes (Signed)
Pt reports he is feeling generalized anxiety (denies medication use) and has also had episodes of emesis since Monday.  Reports being a type II diabetic that has not been able to take his insulin per normal.

## 2016-10-14 ENCOUNTER — Emergency Department (HOSPITAL_COMMUNITY): Payer: 59

## 2016-10-14 ENCOUNTER — Encounter (HOSPITAL_COMMUNITY): Payer: Self-pay | Admitting: Emergency Medicine

## 2016-10-14 ENCOUNTER — Emergency Department (HOSPITAL_COMMUNITY)
Admission: EM | Admit: 2016-10-14 | Discharge: 2016-10-14 | Disposition: A | Discharge status: Home / Self Care | Payer: 59 | Attending: Emergency Medicine | Admitting: Emergency Medicine

## 2016-10-14 DIAGNOSIS — Z794 Long term (current) use of insulin: Secondary | ICD-10-CM

## 2016-10-14 DIAGNOSIS — E118 Type 2 diabetes mellitus with unspecified complications: Secondary | ICD-10-CM

## 2016-10-14 DIAGNOSIS — R112 Nausea with vomiting, unspecified: Secondary | ICD-10-CM

## 2016-10-14 DIAGNOSIS — N289 Disorder of kidney and ureter, unspecified: Secondary | ICD-10-CM

## 2016-10-14 HISTORY — DX: Anxiety disorder, unspecified: F41.9

## 2016-10-14 LAB — I-STAT CHEM 8, ED
BUN: 30 mg/dL — ABNORMAL HIGH (ref 6–20)
CREATININE: 2 mg/dL — AB (ref 0.61–1.24)
Calcium, Ion: 1.22 mmol/L (ref 1.15–1.40)
Chloride: 103 mmol/L (ref 101–111)
GLUCOSE: 202 mg/dL — AB (ref 65–99)
HCT: 36 % — ABNORMAL LOW (ref 39.0–52.0)
HEMOGLOBIN: 12.2 g/dL — AB (ref 13.0–17.0)
POTASSIUM: 4.5 mmol/L (ref 3.5–5.1)
Sodium: 137 mmol/L (ref 135–145)
TCO2: 22 mmol/L (ref 0–100)

## 2016-10-14 MED ORDER — SODIUM CHLORIDE 0.9 % IV BOLUS (SEPSIS)
1000.0000 mL | Freq: Once | INTRAVENOUS | Status: AC
  Administered 2016-10-14: 1000 mL via INTRAVENOUS

## 2016-10-14 MED ORDER — METOCLOPRAMIDE HCL 10 MG PO TABS: 10.0000 mg | ORAL_TABLET | Freq: Three times a day (TID) | ORAL | 0 refills | Status: AC

## 2016-10-14 MED ORDER — SODIUM CHLORIDE 0.9 % IV BOLUS (SEPSIS)
500.0000 mL | Freq: Once | INTRAVENOUS | Status: AC
  Administered 2016-10-14: 500 mL via INTRAVENOUS

## 2016-10-14 MED ORDER — OMEPRAZOLE 20 MG PO CPDR: 20.0000 mg | DELAYED_RELEASE_CAPSULE | Freq: Every day | ORAL | 0 refills | Status: DC

## 2016-10-14 MED ORDER — METOCLOPRAMIDE HCL 5 MG/ML IJ SOLN
10.0000 mg | Freq: Once | INTRAMUSCULAR | Status: AC
  Administered 2016-10-14: 10 mg via INTRAVENOUS
  Filled 2016-10-14: qty 2
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
## 2016-10-28 DIAGNOSIS — I639 Cerebral infarction, unspecified: Secondary | ICD-10-CM

## 2016-10-28 HISTORY — DX: Cerebral infarction, unspecified: I63.9

## 2017-02-16 ENCOUNTER — Other Ambulatory Visit (HOSPITAL_BASED_OUTPATIENT_CLINIC_OR_DEPARTMENT_OTHER): Payer: Self-pay

## 2017-02-16 DIAGNOSIS — G4730 Sleep apnea, unspecified: Secondary | ICD-10-CM

## 2017-03-30 ENCOUNTER — Ambulatory Visit (HOSPITAL_BASED_OUTPATIENT_CLINIC_OR_DEPARTMENT_OTHER): Payer: 59 | Attending: Internal Medicine

## 2017-05-26 ENCOUNTER — Encounter (HOSPITAL_COMMUNITY): Payer: Self-pay | Admitting: Emergency Medicine

## 2017-05-26 ENCOUNTER — Observation Stay (HOSPITAL_COMMUNITY)
Admission: EM | Admit: 2017-05-26 | Discharge: 2017-05-29 | Disposition: A | Discharge status: Home / Self Care | Payer: 59 | Attending: Family Medicine | Admitting: Family Medicine

## 2017-05-26 DIAGNOSIS — D352 Benign neoplasm of pituitary gland: Secondary | ICD-10-CM | POA: Diagnosis not present

## 2017-05-26 DIAGNOSIS — I129 Hypertensive chronic kidney disease with stage 1 through stage 4 chronic kidney disease, or unspecified chronic kidney disease: Secondary | ICD-10-CM | POA: Insufficient documentation

## 2017-05-26 DIAGNOSIS — E860 Dehydration: Secondary | ICD-10-CM | POA: Diagnosis not present

## 2017-05-26 DIAGNOSIS — Z8673 Personal history of transient ischemic attack (TIA), and cerebral infarction without residual deficits: Secondary | ICD-10-CM | POA: Diagnosis not present

## 2017-05-26 DIAGNOSIS — E1122 Type 2 diabetes mellitus with diabetic chronic kidney disease: Secondary | ICD-10-CM | POA: Insufficient documentation

## 2017-05-26 DIAGNOSIS — R112 Nausea with vomiting, unspecified: Principal | ICD-10-CM

## 2017-05-26 DIAGNOSIS — N179 Acute kidney failure, unspecified: Secondary | ICD-10-CM | POA: Diagnosis present

## 2017-05-26 DIAGNOSIS — N390 Urinary tract infection, site not specified: Secondary | ICD-10-CM | POA: Insufficient documentation

## 2017-05-26 DIAGNOSIS — R569 Unspecified convulsions: Secondary | ICD-10-CM

## 2017-05-26 DIAGNOSIS — I10 Essential (primary) hypertension: Secondary | ICD-10-CM | POA: Diagnosis present

## 2017-05-26 DIAGNOSIS — Z794 Long term (current) use of insulin: Secondary | ICD-10-CM | POA: Diagnosis not present

## 2017-05-26 DIAGNOSIS — Z8249 Family history of ischemic heart disease and other diseases of the circulatory system: Secondary | ICD-10-CM | POA: Insufficient documentation

## 2017-05-26 DIAGNOSIS — E1140 Type 2 diabetes mellitus with diabetic neuropathy, unspecified: Secondary | ICD-10-CM | POA: Insufficient documentation

## 2017-05-26 DIAGNOSIS — IMO0002 Reserved for concepts with insufficient information to code with codable children: Secondary | ICD-10-CM | POA: Diagnosis present

## 2017-05-26 DIAGNOSIS — E1165 Type 2 diabetes mellitus with hyperglycemia: Secondary | ICD-10-CM

## 2017-05-26 DIAGNOSIS — K219 Gastro-esophageal reflux disease without esophagitis: Secondary | ICD-10-CM | POA: Diagnosis not present

## 2017-05-26 DIAGNOSIS — N183 Chronic kidney disease, stage 3 (moderate): Secondary | ICD-10-CM | POA: Diagnosis not present

## 2017-05-26 HISTORY — DX: Cerebral infarction, unspecified: I63.9

## 2017-05-26 LAB — COMPREHENSIVE METABOLIC PANEL
ALBUMIN: 4.2 g/dL (ref 3.5–5.0)
ALK PHOS: 85 U/L (ref 38–126)
ALT: 59 U/L (ref 17–63)
ANION GAP: 7 (ref 5–15)
AST: 16 U/L (ref 15–41)
BILIRUBIN TOTAL: 0.7 mg/dL (ref 0.3–1.2)
BUN: 46 mg/dL — AB (ref 6–20)
CALCIUM: 9.1 mg/dL (ref 8.9–10.3)
CO2: 22 mmol/L (ref 22–32)
CREATININE: 3.17 mg/dL — AB (ref 0.61–1.24)
Chloride: 106 mmol/L (ref 101–111)
GFR calc Af Amer: 26 mL/min — ABNORMAL LOW (ref >60)
GFR calc non Af Amer: 22 mL/min — ABNORMAL LOW (ref >60)
GLUCOSE: 193 mg/dL — AB (ref 65–99)
Potassium: 5 mmol/L (ref 3.5–5.1)
Sodium: 135 mmol/L (ref 135–145)
TOTAL PROTEIN: 8.6 g/dL — AB (ref 6.5–8.1)

## 2017-05-26 LAB — URINALYSIS, ROUTINE W REFLEX MICROSCOPIC
BILIRUBIN URINE: NEGATIVE
Glucose, UA: NEGATIVE mg/dL
Hgb urine dipstick: NEGATIVE
KETONES UR: NEGATIVE mg/dL
Leukocytes, UA: NEGATIVE
NITRITE: NEGATIVE
PROTEIN: NEGATIVE mg/dL
Specific Gravity, Urine: 1.014 (ref 1.005–1.030)
pH: 5 (ref 5.0–8.0)

## 2017-05-26 LAB — CBC
HCT: 33.8 % — ABNORMAL LOW (ref 39.0–52.0)
HEMOGLOBIN: 11.5 g/dL — AB (ref 13.0–17.0)
MCH: 26.7 pg (ref 26.0–34.0)
MCHC: 34 g/dL (ref 30.0–36.0)
MCV: 78.4 fL (ref 78.0–100.0)
PLATELETS: 363 10*3/uL (ref 150–400)
RBC: 4.31 MIL/uL (ref 4.22–5.81)
RDW: 13.9 % (ref 11.5–15.5)
WBC: 7.7 10*3/uL (ref 4.0–10.5)

## 2017-05-26 LAB — LIPASE, BLOOD: Lipase: 78 U/L — ABNORMAL HIGH (ref 11–51)

## 2017-05-26 MED ORDER — MORPHINE SULFATE (PF) 4 MG/ML IV SOLN
4.0000 mg | Freq: Once | INTRAVENOUS | Status: AC
  Administered 2017-05-27: 4 mg via INTRAVENOUS
  Filled 2017-05-26: qty 1

## 2017-05-26 MED ORDER — ONDANSETRON HCL 4 MG/2ML IJ SOLN
4.0000 mg | Freq: Once | INTRAMUSCULAR | Status: AC
  Administered 2017-05-27: 4 mg via INTRAVENOUS
  Filled 2017-05-26: qty 2

## 2017-05-26 MED ORDER — SODIUM CHLORIDE 0.9 % IV BOLUS (SEPSIS)
1000.0000 mL | Freq: Once | INTRAVENOUS | Status: AC
  Administered 2017-05-27: 1000 mL via INTRAVENOUS

## 2017-05-26 MED ORDER — IOPAMIDOL (ISOVUE-300) INJECTION 61%
30.0000 mL | Freq: Once | INTRAVENOUS | Status: AC | PRN
  Administered 2017-05-26: 30 mL via ORAL

## 2017-05-26 MED ORDER — ONDANSETRON 4 MG PO TBDP
4.0000 mg | ORAL_TABLET | Freq: Once | ORAL | Status: AC | PRN
  Administered 2017-05-26: 4 mg via ORAL

## 2017-05-26 MED ORDER — IOPAMIDOL (ISOVUE-300) INJECTION 61%
INTRAVENOUS | Status: AC
  Filled 2017-05-26: qty 30

## 2017-05-26 MED ORDER — ONDANSETRON 4 MG PO TBDP
ORAL_TABLET | ORAL | Status: AC
  Filled 2017-05-26: qty 1

## 2017-05-26 NOTE — ED Triage Notes (Signed)
Pt reports emesis for the past 3 days. No diarrhea. Began to have abd pain after emesis.
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
## 2017-05-27 ENCOUNTER — Emergency Department (HOSPITAL_COMMUNITY): Payer: 59

## 2017-05-27 ENCOUNTER — Encounter (HOSPITAL_COMMUNITY): Payer: Self-pay | Admitting: Radiology

## 2017-05-27 DIAGNOSIS — N179 Acute kidney failure, unspecified: Secondary | ICD-10-CM | POA: Diagnosis not present

## 2017-05-27 DIAGNOSIS — R112 Nausea with vomiting, unspecified: Secondary | ICD-10-CM

## 2017-05-27 LAB — CBC WITH DIFFERENTIAL/PLATELET
Basophils Absolute: 0.1 10*3/uL (ref 0.0–0.1)
Basophils Relative: 1 %
Eosinophils Absolute: 0.1 10*3/uL (ref 0.0–0.7)
Eosinophils Relative: 2 %
HCT: 30 % — ABNORMAL LOW (ref 39.0–52.0)
HEMOGLOBIN: 10.3 g/dL — AB (ref 13.0–17.0)
LYMPHS ABS: 1.6 10*3/uL (ref 0.7–4.0)
Lymphocytes Relative: 27 %
MCH: 26.5 pg (ref 26.0–34.0)
MCHC: 34.3 g/dL (ref 30.0–36.0)
MCV: 77.1 fL — ABNORMAL LOW (ref 78.0–100.0)
MONOS PCT: 13 %
Monocytes Absolute: 0.8 10*3/uL (ref 0.1–1.0)
NEUTROS ABS: 3.4 10*3/uL (ref 1.7–7.7)
NEUTROS PCT: 57 %
Platelets: 271 10*3/uL (ref 150–400)
RBC: 3.89 MIL/uL — ABNORMAL LOW (ref 4.22–5.81)
RDW: 13.8 % (ref 11.5–15.5)
WBC: 6 10*3/uL (ref 4.0–10.5)

## 2017-05-27 LAB — BASIC METABOLIC PANEL
ANION GAP: 7 (ref 5–15)
BUN: 43 mg/dL — ABNORMAL HIGH (ref 6–20)
CO2: 21 mmol/L — ABNORMAL LOW (ref 22–32)
Calcium: 8.5 mg/dL — ABNORMAL LOW (ref 8.9–10.3)
Chloride: 108 mmol/L (ref 101–111)
Creatinine, Ser: 2.88 mg/dL — ABNORMAL HIGH (ref 0.61–1.24)
GFR calc Af Amer: 29 mL/min — ABNORMAL LOW (ref >60)
GFR, EST NON AFRICAN AMERICAN: 25 mL/min — AB (ref >60)
Glucose, Bld: 153 mg/dL — ABNORMAL HIGH (ref 65–99)
POTASSIUM: 4.9 mmol/L (ref 3.5–5.1)
SODIUM: 136 mmol/L (ref 135–145)

## 2017-05-27 LAB — HIV ANTIBODY (ROUTINE TESTING W REFLEX): HIV SCREEN 4TH GENERATION: NONREACTIVE

## 2017-05-27 LAB — HEPATIC FUNCTION PANEL
ALT: 44 U/L (ref 17–63)
AST: 12 U/L — AB (ref 15–41)
Albumin: 3.5 g/dL (ref 3.5–5.0)
Alkaline Phosphatase: 76 U/L (ref 38–126)
Bilirubin, Direct: <0.1 mg/dL — ABNORMAL LOW (ref 0.1–0.5)
TOTAL PROTEIN: 7.2 g/dL (ref 6.5–8.1)
Total Bilirubin: 0.6 mg/dL (ref 0.3–1.2)

## 2017-05-27 LAB — GLUCOSE, CAPILLARY
GLUCOSE-CAPILLARY: 170 mg/dL — AB (ref 65–99)
Glucose-Capillary: 150 mg/dL — ABNORMAL HIGH (ref 65–99)
Glucose-Capillary: 158 mg/dL — ABNORMAL HIGH (ref 65–99)
Glucose-Capillary: 138 mg/dL — ABNORMAL HIGH (ref 65–99)

## 2017-05-27 LAB — MAGNESIUM: Magnesium: 1.3 mg/dL — ABNORMAL LOW (ref 1.7–2.4)

## 2017-05-27 LAB — LIPASE, BLOOD: Lipase: 67 U/L — ABNORMAL HIGH (ref 11–51)

## 2017-05-27 MED ORDER — ATORVASTATIN CALCIUM 40 MG PO TABS
40.0000 mg | ORAL_TABLET | Freq: Every day | ORAL | Status: DC
  Administered 2017-05-27 – 2017-05-29 (×3): 40 mg via ORAL
  Filled 2017-05-27 (×3): qty 1

## 2017-05-27 MED ORDER — ACETAMINOPHEN 650 MG RE SUPP: 650.0000 mg | Freq: Four times a day (QID) | RECTAL | Status: DC | PRN

## 2017-05-27 MED ORDER — HYDRALAZINE HCL 20 MG/ML IJ SOLN
10.0000 mg | INTRAMUSCULAR | Status: DC | PRN
  Administered 2017-05-27: 10 mg via INTRAVENOUS
  Filled 2017-05-27: qty 0.5

## 2017-05-27 MED ORDER — SODIUM CHLORIDE 0.9 % IV SOLN
500.0000 mg | Freq: Two times a day (BID) | INTRAVENOUS | Status: DC
  Administered 2017-05-27 – 2017-05-29 (×5): 500 mg via INTRAVENOUS
  Filled 2017-05-27 (×5): qty 5

## 2017-05-27 MED ORDER — DEXTROSE 5 % IV SOLN
1.0000 g | Freq: Every day | INTRAVENOUS | Status: DC
  Administered 2017-05-27 – 2017-05-29 (×3): 1 g via INTRAVENOUS
  Filled 2017-05-27 (×3): qty 10

## 2017-05-27 MED ORDER — METOCLOPRAMIDE HCL 5 MG/ML IJ SOLN
5.0000 mg | Freq: Four times a day (QID) | INTRAMUSCULAR | Status: AC
  Administered 2017-05-27 (×2): 5 mg via INTRAVENOUS
  Filled 2017-05-27 (×2): qty 2

## 2017-05-27 MED ORDER — INSULIN GLARGINE 100 UNIT/ML ~~LOC~~ SOLN
10.0000 [IU] | Freq: Every day | SUBCUTANEOUS | Status: DC
  Administered 2017-05-27 – 2017-05-28 (×2): 10 [IU] via SUBCUTANEOUS
  Filled 2017-05-27 (×5): qty 0.1

## 2017-05-27 MED ORDER — ONDANSETRON HCL 4 MG/2ML IJ SOLN: 4.0000 mg | Freq: Four times a day (QID) | INTRAMUSCULAR | Status: DC | PRN

## 2017-05-27 MED ORDER — ONDANSETRON HCL 4 MG PO TABS: 4.0000 mg | ORAL_TABLET | Freq: Four times a day (QID) | ORAL | Status: DC | PRN

## 2017-05-27 MED ORDER — SODIUM CHLORIDE 0.9 % IV SOLN
INTRAVENOUS | Status: AC
  Administered 2017-05-27 (×3): via INTRAVENOUS

## 2017-05-27 MED ORDER — AMLODIPINE BESYLATE 5 MG PO TABS
5.0000 mg | ORAL_TABLET | Freq: Every day | ORAL | Status: DC
  Administered 2017-05-27 – 2017-05-29 (×3): 5 mg via ORAL
  Filled 2017-05-27 (×3): qty 1

## 2017-05-27 MED ORDER — HEPARIN SODIUM (PORCINE) 5000 UNIT/ML IJ SOLN
5000.0000 [IU] | Freq: Three times a day (TID) | INTRAMUSCULAR | Status: DC
  Administered 2017-05-27 – 2017-05-29 (×6): 5000 [IU] via SUBCUTANEOUS
  Filled 2017-05-27 (×6): qty 1

## 2017-05-27 MED ORDER — INSULIN ASPART 100 UNIT/ML ~~LOC~~ SOLN
0.0000 [IU] | Freq: Three times a day (TID) | SUBCUTANEOUS | Status: DC
  Administered 2017-05-27 (×2): 1 [IU] via SUBCUTANEOUS
  Administered 2017-05-27: 2 [IU] via SUBCUTANEOUS
  Administered 2017-05-28: 4 [IU] via SUBCUTANEOUS
  Administered 2017-05-29: 2 [IU] via SUBCUTANEOUS

## 2017-05-27 MED ORDER — PANTOPRAZOLE SODIUM 40 MG IV SOLR
40.0000 mg | INTRAVENOUS | Status: DC
  Administered 2017-05-27 – 2017-05-29 (×3): 40 mg via INTRAVENOUS
  Filled 2017-05-27 (×3): qty 40

## 2017-05-27 MED ORDER — ACETAMINOPHEN 325 MG PO TABS: 650.0000 mg | ORAL_TABLET | Freq: Four times a day (QID) | ORAL | Status: DC | PRN

## 2017-05-27 MED ORDER — PREGABALIN 50 MG PO CAPS
100.0000 mg | ORAL_CAPSULE | Freq: Two times a day (BID) | ORAL | Status: DC
  Administered 2017-05-27 – 2017-05-29 (×5): 100 mg via ORAL
  Filled 2017-05-27 (×5): qty 2

## 2017-05-27 MED ORDER — SODIUM CHLORIDE 0.9 % IV BOLUS (SEPSIS)
1000.0000 mL | Freq: Once | INTRAVENOUS | Status: AC
  Administered 2017-05-27: 1000 mL via INTRAVENOUS

## 2017-05-27 NOTE — ED Notes (Signed)
Please call report to Joellen [place], [place] at [phone] at [phone]

## 2017-05-27 NOTE — ED Notes (Signed)
Pt arrived back from CT
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
## 2017-05-27 NOTE — Progress Notes (Signed)
Patients BP elevated. Reece Levy MD notified. Verbal order given for 10mg  IV Hydralazine. Will follow out and continue to monitor patient.

## 2017-05-27 NOTE — Progress Notes (Signed)
Pharmacy Antibiotic Note  Andy Allende is a 46 y.o. male admitted on 05/26/2017 with UTI.  Pharmacy has been consulted for rocephin dosing.  Plan: Rocephin 1 Gm IV q24h Rx will sign off as no further adjustments needed  Height: 6\' 1"  (185.4 cm) Weight: 213 lb 12.8 oz (97 kg) IBW/kg (Calculated) : 79.9  Temp (24hrs), Avg:98.2 F (36.8 C), Min:97.9 F (36.6 C), Max:98.4 F (36.9 C)   Recent Labs Lab 05/26/17 1945  WBC 7.7  CREATININE 3.17*    Estimated Creatinine Clearance: 36.5 mL/min (A) (by C-G formula based on SCr of 3.17 mg/dL (H)).    No Known Allergies  Antimicrobials this admission: 9/7 rocephin >>    >>   Dose adjustments this admission:   Microbiology results:  BCx:   UCx:    Sputum:    MRSA PCR:   Thank you for allowing pharmacy to be a part of this patient's care.  Dorrene German 05/27/2017 6:46 AM

## 2017-05-27 NOTE — Progress Notes (Signed)
Subjective: Patient admitted this morning, see detailed H&P by Dr Hal Hope 46 y.o. male with history of diabetes mellitus type 2, hypertension, stroke and history of pituitary adenoma being observed presents to the ER with complaints of nausea vomiting. Patient has been having nausea vomiting for last 3 days. Abdominal discomfort mostly in the epigastric area. Denies any diarrhea. Denies any chest pain shortness of breath fever chills dysuria or discharges. As per the patient's wife patient has had these episodes episodically every 3 or 4 months. And was told that it was due to his diabetes  Vitals:   05/27/17 0700 05/27/17 1434  BP: (!) 143/104 (!) 94/56  Pulse:  83  Resp:  16  Temp:  97.8 F (36.6 C)  SpO2:  98%    On exam Abdomen- soft, nontender, no organomegaly  A/P Intractable nausea and vomiting UTI Acute kidney injury Hypertension History of seizures History of stroke History of pituitary adenoma being followed as outpatient  Continue Reglan Follow urine culture results, continue ceftriaxone [place] [facility] [phone]

## 2017-05-28 DIAGNOSIS — R112 Nausea with vomiting, unspecified: Secondary | ICD-10-CM | POA: Diagnosis not present

## 2017-05-28 DIAGNOSIS — E1165 Type 2 diabetes mellitus with hyperglycemia: Secondary | ICD-10-CM | POA: Diagnosis not present

## 2017-05-28 DIAGNOSIS — E1140 Type 2 diabetes mellitus with diabetic neuropathy, unspecified: Secondary | ICD-10-CM | POA: Diagnosis not present

## 2017-05-28 DIAGNOSIS — R569 Unspecified convulsions: Secondary | ICD-10-CM

## 2017-05-28 DIAGNOSIS — I10 Essential (primary) hypertension: Secondary | ICD-10-CM

## 2017-05-28 DIAGNOSIS — N179 Acute kidney failure, unspecified: Secondary | ICD-10-CM | POA: Diagnosis not present

## 2017-05-28 LAB — CBC
HCT: 28.6 % — ABNORMAL LOW (ref 39.0–52.0)
HEMOGLOBIN: 9.7 g/dL — AB (ref 13.0–17.0)
MCH: 26.8 pg (ref 26.0–34.0)
MCHC: 33.9 g/dL (ref 30.0–36.0)
MCV: 79 fL (ref 78.0–100.0)
PLATELETS: 295 10*3/uL (ref 150–400)
RBC: 3.62 MIL/uL — AB (ref 4.22–5.81)
RDW: 14 % (ref 11.5–15.5)
WBC: 5.1 10*3/uL (ref 4.0–10.5)

## 2017-05-28 LAB — URINE CULTURE: CULTURE: NO GROWTH

## 2017-05-28 LAB — COMPREHENSIVE METABOLIC PANEL
ALBUMIN: 3.3 g/dL — AB (ref 3.5–5.0)
ALK PHOS: 69 U/L (ref 38–126)
ALT: 35 U/L (ref 17–63)
AST: 11 U/L — ABNORMAL LOW (ref 15–41)
Anion gap: 6 (ref 5–15)
BUN: 38 mg/dL — AB (ref 6–20)
CALCIUM: 8.4 mg/dL — AB (ref 8.9–10.3)
CO2: 20 mmol/L — AB (ref 22–32)
Chloride: 112 mmol/L — ABNORMAL HIGH (ref 101–111)
Creatinine, Ser: 2.66 mg/dL — ABNORMAL HIGH (ref 0.61–1.24)
GFR calc Af Amer: 32 mL/min — ABNORMAL LOW (ref >60)
GFR calc non Af Amer: 28 mL/min — ABNORMAL LOW (ref >60)
GLUCOSE: 112 mg/dL — AB (ref 65–99)
Potassium: 5.2 mmol/L — ABNORMAL HIGH (ref 3.5–5.1)
SODIUM: 138 mmol/L (ref 135–145)
Total Bilirubin: 0.5 mg/dL (ref 0.3–1.2)
Total Protein: 6.8 g/dL (ref 6.5–8.1)

## 2017-05-28 LAB — GLUCOSE, CAPILLARY
GLUCOSE-CAPILLARY: 159 mg/dL — AB (ref 65–99)
Glucose-Capillary: 108 mg/dL — ABNORMAL HIGH (ref 65–99)
Glucose-Capillary: 201 mg/dL — ABNORMAL HIGH (ref 65–99)
Glucose-Capillary: 76 mg/dL (ref 65–99)

## 2017-05-28 MED ORDER — SODIUM CHLORIDE 0.9 % IV SOLN
INTRAVENOUS | Status: DC
  Administered 2017-05-28 – 2017-05-29 (×3): via INTRAVENOUS
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
## 2017-05-29 DIAGNOSIS — E860 Dehydration: Secondary | ICD-10-CM

## 2017-05-29 DIAGNOSIS — N179 Acute kidney failure, unspecified: Secondary | ICD-10-CM | POA: Diagnosis not present

## 2017-05-29 DIAGNOSIS — R569 Unspecified convulsions: Secondary | ICD-10-CM | POA: Diagnosis not present

## 2017-05-29 DIAGNOSIS — I10 Essential (primary) hypertension: Secondary | ICD-10-CM | POA: Diagnosis not present

## 2017-05-29 DIAGNOSIS — R112 Nausea with vomiting, unspecified: Secondary | ICD-10-CM | POA: Diagnosis not present

## 2017-05-29 DIAGNOSIS — E1140 Type 2 diabetes mellitus with diabetic neuropathy, unspecified: Secondary | ICD-10-CM | POA: Diagnosis not present

## 2017-05-29 LAB — CBC
HCT: 27.6 % — ABNORMAL LOW (ref 39.0–52.0)
Hemoglobin: 9.3 g/dL — ABNORMAL LOW (ref 13.0–17.0)
MCH: 26.8 pg (ref 26.0–34.0)
MCHC: 33.7 g/dL (ref 30.0–36.0)
MCV: 79.5 fL (ref 78.0–100.0)
PLATELETS: 246 10*3/uL (ref 150–400)
RBC: 3.47 MIL/uL — ABNORMAL LOW (ref 4.22–5.81)
RDW: 14.2 % (ref 11.5–15.5)
WBC: 5.2 10*3/uL (ref 4.0–10.5)

## 2017-05-29 LAB — BASIC METABOLIC PANEL
ANION GAP: 5 (ref 5–15)
BUN: 34 mg/dL — ABNORMAL HIGH (ref 6–20)
CO2: 17 mmol/L — AB (ref 22–32)
Calcium: 8 mg/dL — ABNORMAL LOW (ref 8.9–10.3)
Chloride: 114 mmol/L — ABNORMAL HIGH (ref 101–111)
Creatinine, Ser: 2.71 mg/dL — ABNORMAL HIGH (ref 0.61–1.24)
GFR calc Af Amer: 31 mL/min — ABNORMAL LOW (ref >60)
GFR, EST NON AFRICAN AMERICAN: 27 mL/min — AB (ref >60)
GLUCOSE: 176 mg/dL — AB (ref 65–99)
Potassium: 4.7 mmol/L (ref 3.5–5.1)
Sodium: 136 mmol/L (ref 135–145)

## 2017-05-29 MED ORDER — LEVETIRACETAM 500 MG PO TABS: 500.0000 mg | ORAL_TABLET | Freq: Two times a day (BID) | ORAL | Status: DC

## 2017-05-29 MED ORDER — OMEPRAZOLE 20 MG PO CPDR: 20.0000 mg | DELAYED_RELEASE_CAPSULE | Freq: Every day | ORAL | 1 refills | Status: AC

## 2017-05-29 MED ORDER — PANTOPRAZOLE SODIUM 40 MG PO TBEC: 40.0000 mg | DELAYED_RELEASE_TABLET | Freq: Every day | ORAL | Status: DC

## 2017-05-29 NOTE — Progress Notes (Signed)
Patient discharged home. Discharge instruction and prescription given and explained to patient and he verblaized understanding, Patient denies any pain/distress. No wound noted. Skin intact.  Transported home by friend.
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
## 2017-05-29 NOTE — Progress Notes (Signed)
PHARMACIST - PHYSICIAN COMMUNICATION  DR:   Darrick Meigs CONCERNING: IV to Oral Route Change Policy  RECOMMENDATION: This patient is receiving keppra and protonix by the intravenous route.  Based on criteria approved by the Pharmacy and Therapeutics Committee, the intravenous medication(s) is/are being converted to the equivalent oral dose form(s).   DESCRIPTION: These criteria include:  The patient is eating (either orally or via tube) and/or has been taking other orally administered medications for a least 24 hours  The patient has no evidence of active gastrointestinal bleeding or impaired GI absorption (gastrectomy, short bowel, patient on TNA or NPO).  If you have questions about this conversion, please contact the Pharmacy Department  []   [phone] )  [facility] []   [phone] )  [facility] []   [phone] )  [facility] []   [phone] )  [facility] [x]   [phone] )  [place], [facility] 05/29/2017 11:53 AM

## 2017-05-30 LAB — GLUCOSE, CAPILLARY
GLUCOSE-CAPILLARY: 184 mg/dL — AB (ref 65–99)
Glucose-Capillary: 101 mg/dL — ABNORMAL HIGH (ref 65–99)
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
